# Patient Record
Sex: Male | Born: 1970 | Race: White | Hispanic: No | Marital: Married | Smoking: Never smoker
Health system: Southern US, Community
[De-identification: ages and names within clinical notes are randomized; demographics above are authoritative.]

## PROBLEM LIST (undated history)

## (undated) DIAGNOSIS — M25562 Pain in left knee: Secondary | ICD-10-CM

## (undated) DIAGNOSIS — K219 Gastro-esophageal reflux disease without esophagitis: Secondary | ICD-10-CM

## (undated) DIAGNOSIS — Z531 Procedure and treatment not carried out because of patient's decision for reasons of belief and group pressure: Secondary | ICD-10-CM

## (undated) DIAGNOSIS — F419 Anxiety disorder, unspecified: Secondary | ICD-10-CM

## (undated) DIAGNOSIS — K420 Umbilical hernia with obstruction, without gangrene: Secondary | ICD-10-CM

## (undated) DIAGNOSIS — IMO0001 Reserved for inherently not codable concepts without codable children: Secondary | ICD-10-CM

## (undated) HISTORY — PX: HERNIA REPAIR: SHX51

## (undated) HISTORY — DX: Anxiety disorder, unspecified: F41.9

## (undated) HISTORY — PX: VASECTOMY: SHX75

## (undated) HISTORY — PX: FRACTURE SURGERY: SHX138

## (undated) HISTORY — PX: EYE SURGERY: SHX253

## (undated) HISTORY — PX: ANTERIOR CRUCIATE LIGAMENT REPAIR: SHX115

## (undated) HISTORY — PX: NASAL SEPTUM SURGERY: SHX37

---

## 2011-02-21 ENCOUNTER — Other Ambulatory Visit: Payer: Self-pay

## 2011-02-21 ENCOUNTER — Ambulatory Visit (INDEPENDENT_AMBULATORY_CARE_PROVIDER_SITE_OTHER): Payer: BC Managed Care – PPO

## 2011-02-21 ENCOUNTER — Encounter: Payer: Self-pay | Admitting: *Deleted

## 2011-02-21 ENCOUNTER — Emergency Department (HOSPITAL_COMMUNITY): Payer: BC Managed Care – PPO

## 2011-02-21 ENCOUNTER — Observation Stay (HOSPITAL_COMMUNITY)
Admission: AD | Admit: 2011-02-21 | Discharge: 2011-02-21 | Disposition: A | Discharge status: Home / Self Care | Payer: BC Managed Care – PPO | Attending: Family Medicine | Admitting: Family Medicine

## 2011-02-21 DIAGNOSIS — Z531 Procedure and treatment not carried out because of patient's decision for reasons of belief and group pressure: Secondary | ICD-10-CM

## 2011-02-21 DIAGNOSIS — R030 Elevated blood-pressure reading, without diagnosis of hypertension: Secondary | ICD-10-CM

## 2011-02-21 DIAGNOSIS — IMO0001 Reserved for inherently not codable concepts without codable children: Secondary | ICD-10-CM | POA: Insufficient documentation

## 2011-02-21 DIAGNOSIS — R079 Chest pain, unspecified: Secondary | ICD-10-CM

## 2011-02-21 DIAGNOSIS — K219 Gastro-esophageal reflux disease without esophagitis: Principal | ICD-10-CM | POA: Insufficient documentation

## 2011-02-21 DIAGNOSIS — R9431 Abnormal electrocardiogram [ECG] [EKG]: Secondary | ICD-10-CM

## 2011-02-21 HISTORY — DX: Gastro-esophageal reflux disease without esophagitis: K21.9

## 2011-02-21 HISTORY — DX: Procedure and treatment not carried out because of patient's decision for reasons of belief and group pressure: Z53.1

## 2011-02-21 HISTORY — DX: Reserved for inherently not codable concepts without codable children: IMO0001

## 2011-02-21 LAB — DIFFERENTIAL
Basophils Absolute: 0 10*3/uL (ref 0.0–0.1)
Lymphocytes Relative: 18 % (ref 12–46)
Lymphs Abs: 1.1 10*3/uL (ref 0.7–4.0)
Neutro Abs: 4 10*3/uL (ref 1.7–7.7)
Neutrophils Relative %: 68 % (ref 43–77)

## 2011-02-21 LAB — BASIC METABOLIC PANEL
CO2: 24 mEq/L (ref 19–32)
Calcium: 9.8 mg/dL (ref 8.4–10.5)
Chloride: 104 mEq/L (ref 96–112)
Creatinine, Ser: 0.86 mg/dL (ref 0.50–1.35)
GFR calc Af Amer: >90 mL/min (ref >90)
Sodium: 135 mEq/L (ref 135–145)

## 2011-02-21 LAB — CBC
HCT: 42.4 % (ref 39.0–52.0)
MCH: 30.8 pg (ref 26.0–34.0)
MCV: 85.3 fL (ref 78.0–100.0)
Platelets: 198 10*3/uL (ref 150–400)
RBC: 4.94 MIL/uL (ref 4.22–5.81)
RDW: 12.8 % (ref 11.5–15.5)
RDW: 12.8 % (ref 11.5–15.5)
WBC: 5.8 10*3/uL (ref 4.0–10.5)
WBC: 6.9 10*3/uL (ref 4.0–10.5)

## 2011-02-21 LAB — COMPREHENSIVE METABOLIC PANEL
ALT: 43 U/L (ref 0–53)
AST: 26 U/L (ref 0–37)
Albumin: 3.9 g/dL (ref 3.5–5.2)
Alkaline Phosphatase: 58 U/L (ref 39–117)
BUN: 20 mg/dL (ref 6–23)
Chloride: 106 mEq/L (ref 96–112)
Potassium: 4.4 mEq/L (ref 3.5–5.1)
Sodium: 138 mEq/L (ref 135–145)
Total Protein: 6.9 g/dL (ref 6.0–8.3)

## 2011-02-21 LAB — TSH: TSH: 1.665 u[IU]/mL (ref 0.350–4.500)

## 2011-02-21 LAB — CARDIAC PANEL(CRET KIN+CKTOT+MB+TROPI)
CK, MB: 1.7 ng/mL (ref 0.3–4.0)
Troponin I: <0.3 ng/mL (ref <0.30)

## 2011-02-21 LAB — POCT I-STAT TROPONIN I: Troponin i, poc: 0 ng/mL (ref 0.00–0.08)

## 2011-02-21 MED ORDER — SODIUM CHLORIDE 0.9 % IJ SOLN: 3.0000 mL | Freq: Two times a day (BID) | INTRAMUSCULAR | Status: DC

## 2011-02-21 MED ORDER — SODIUM CHLORIDE 0.9 % IV SOLN
INTRAVENOUS | Status: DC
  Administered 2011-02-21: 100 mL via INTRAVENOUS

## 2011-02-21 MED ORDER — SODIUM CHLORIDE 0.9 % IV SOLN: 250.0000 mL | INTRAVENOUS | Status: DC | PRN

## 2011-02-21 MED ORDER — NITROGLYCERIN 0.4 MG/SPRAY TL SOLN: 1.0000 | Status: DC | PRN

## 2011-02-21 MED ORDER — PANTOPRAZOLE SODIUM 40 MG PO TBEC
40.0000 mg | DELAYED_RELEASE_TABLET | Freq: Every day | ORAL | Status: DC
  Administered 2011-02-21: 40 mg via ORAL
  Filled 2011-02-21: qty 1

## 2011-02-21 MED ORDER — ASPIRIN 81 MG PO CHEW
324.0000 mg | CHEWABLE_TABLET | Freq: Once | ORAL | Status: AC
  Administered 2011-02-21: 324 mg via ORAL
  Filled 2011-02-21: qty 4

## 2011-02-21 MED ORDER — SODIUM CHLORIDE 0.9 % IJ SOLN: 3.0000 mL | INTRAMUSCULAR | Status: DC | PRN

## 2011-02-21 MED ORDER — HEPARIN SODIUM (PORCINE) 5000 UNIT/ML IJ SOLN
5000.0000 [IU] | Freq: Three times a day (TID) | INTRAMUSCULAR | Status: DC
  Administered 2011-02-21: 5000 [IU] via SUBCUTANEOUS
  Filled 2011-02-21 (×3): qty 1

## 2011-02-21 MED ORDER — NITROGLYCERIN 0.4 MG SL SUBL
0.4000 mg | SUBLINGUAL_TABLET | SUBLINGUAL | Status: DC | PRN
  Administered 2011-02-21: 0.4 mg via SUBLINGUAL
  Filled 2011-02-21: qty 25

## 2011-02-21 NOTE — ED Provider Notes (Signed)
History     CSN: [id number]  Arrival date & time 02/21/11  0848   Chief Complaint  Patient presents with  . Chest Pain    HPI Pt was seen at 0850.  Per pt, c/o gradual onset and persistence of constant left sided chest "pain" that began this morning after he woke up.  Describes the pain as "pressure" and "dull."  Denies radiation of pain, SOB, nausea or diaphoresis.  States he had similar episode approx 1 week ago that lasted 1 day before resolving.  Pt has not taken any OTC's for pain.  Pt went to [facility], then was sent to the ED for further eval.  Denies palpitations, no cough, no N/V/D, no abd pain, no back pain, no fevers, no injury.     History reviewed. No pertinent past medical history.  Past Surgical History  Procedure Date  . Fracture surgery   . Anterior cruciate ligament repair     Family History  Problem Relation Age of Onset  . Hypertension Father     History  Substance Use Topics  . Smoking status: Never Smoker   . Smokeless tobacco: Not on file  . Alcohol Use: 2.4 oz/week    4 Cans of beer per week    Review of Systems ROS: Statement: All systems negative except as marked or noted in the HPI; Constitutional: Negative for fever and chills. ; ; Eyes: Negative for eye pain, redness and discharge. ; ; ENMT: Negative for ear pain, hoarseness, nasal congestion, sinus pressure and sore throat. ; ; Cardiovascular: +CP.  Negative for palpitations, diaphoresis, dyspnea and peripheral edema. ; ; Respiratory: Negative for cough, wheezing and stridor. ; ; Gastrointestinal: Negative for nausea, vomiting, diarrhea, abdominal pain, blood in stool, hematemesis, jaundice and rectal bleeding. . ; ; Genitourinary: Negative for dysuria, flank pain and hematuria. ; ; Musculoskeletal: Negative for back pain and neck pain. Negative for swelling and trauma.; ; Skin: Negative for pruritus, rash, abrasions, blisters, bruising and skin lesion.; ; Neuro: Negative for headache,  lightheadedness and neck stiffness. Negative for weakness, altered level of consciousness , altered mental status, extremity weakness, paresthesias, involuntary movement, seizure and syncope.     Allergies  Review of patient's allergies indicates no known allergies.  Home Medications   Current Outpatient Rx  Name Route Sig Dispense Refill  . IBUPROFEN 200 MG PO TABS Oral Take 800 mg by mouth 3 (three) times daily as needed. As needed for headache    . OVER THE COUNTER MEDICATION Oral Take 2 tablets by mouth once. Over the counter sinus product       BP 158/96  Temp(Src) 98.3 F (36.8 C) (Oral)  Resp 16  Ht 5\' 7"  (1.702 m)  Wt 213 lb (96.616 kg)  BMI 33.36 kg/m2  SpO2 97%  Physical Exam 0855: Physical examination:  Nursing notes reviewed; Vital signs and O2 SAT reviewed;  Constitutional: Well developed, Well nourished, Well hydrated, In no acute distress; Head:  Normocephalic, atraumatic; Eyes: EOMI, PERRL, No scleral icterus; ENMT: Mouth and pharynx normal, Mucous membranes moist; Neck: Supple, Full range of motion, No lymphadenopathy; Cardiovascular: Regular rate and rhythm, No murmur, rub, or gallop; Respiratory: Breath sounds clear & equal bilaterally, No rales, rhonchi, wheezes, or rub, Normal respiratory effort/excursion; Chest: Nontender, Movement normal; Abdomen: Soft, Nontender, Nondistended, Normal bowel sounds; Extremities: Pulses normal, No tenderness, No edema, No calf edema or asymmetry.; Neuro: AA&Ox3, Major CN grossly intact.  No gross focal motor or sensory deficits in extremities.;  Skin: Color normal, Warm, Dry, no rash.    ED Course  Procedures   MDM  MDM Reviewed: nursing note and vitals Interpretation: labs, ECG and x-ray    Date: 02/21/2011  Rate: 93  Rhythm: normal sinus rhythm  QRS Axis: normal  Intervals: normal  ST/T Wave abnormalities: nonspecific T wave changes, flipped T-waves ant leads  Conduction Disutrbances:none  Narrative Interpretation:    Old EKG Reviewed: none available.  Results for orders placed during the hospital encounter of 02/21/11  BASIC METABOLIC PANEL      Component Value Range   Sodium 135  135 - 145 (mEq/L)   Potassium 4.1  3.5 - 5.1 (mEq/L)   Chloride 104  96 - 112 (mEq/L)   CO2 24  19 - 32 (mEq/L)   Glucose, Bld 114 (*) 70 - 99 (mg/dL)   BUN 20  6 - 23 (mg/dL)   Creatinine, Ser 0.98  0.50 - 1.35 (mg/dL)   Calcium 9.8  8.4 - 11.9 (mg/dL)   GFR calc non Af Amer >90  >90 (mL/min)   GFR calc Af Amer >90  >90 (mL/min)  CBC      Component Value Range   WBC 5.8  4.0 - 10.5 (K/uL)   RBC 4.94  4.22 - 5.81 (MIL/uL)   Hemoglobin 15.2  13.0 - 17.0 (g/dL)   HCT 14.7  82.9 - 56.2 (%)   MCV 84.8  78.0 - 100.0 (fL)   MCH 30.8  26.0 - 34.0 (pg)   MCHC 36.3 (*) 30.0 - 36.0 (g/dL)   RDW 13.0  86.5 - 78.4 (%)   Platelets 198  150 - 400 (K/uL)  DIFFERENTIAL      Component Value Range   Neutrophils Relative 68  43 - 77 (%)   Neutro Abs 4.0  1.7 - 7.7 (K/uL)   Lymphocytes Relative 18  12 - 46 (%)   Lymphs Abs 1.1  0.7 - 4.0 (K/uL)   Monocytes Relative 11  3 - 12 (%)   Monocytes Absolute 0.7  0.1 - 1.0 (K/uL)   Eosinophils Relative 2  0 - 5 (%)   Eosinophils Absolute 0.1  0.0 - 0.7 (K/uL)   Basophils Relative 0  0 - 1 (%)   Basophils Absolute 0.0  0.0 - 0.1 (K/uL)  POCT I-STAT TROPONIN I      Component Value Range   Troponin i, poc 0.00  0.00 - 0.08 (ng/mL)   Comment 3            Dg Chest Port 1 View  02/21/2011  *RADIOLOGY REPORT*  Clinical Data: Left-sided chest pain and pressure  PORTABLE CHEST - 1 VIEW  Comparison: None.  Findings: The lungs are clear. The heart appears borderline enlarged.  No bony abnormality is seen.  IMPRESSION: No active lung disease.  Borderline cardiomegaly.  Original Report Authenticated By: Juline Patch, M.D.      10:15 AM:  Symptoms relieved after ntg and ASA.  EKG with TWI, no old to compare.  Dx testing d/w pt and family.  Questions answered.  Verb understanding, agreeable to  admit.  1050:  T/C to [facility] resident, case discussed, including:  HPI, pertinent PM/SHx, VS/PE, dx testing, ED course and treatment.  Agreeable to admit, will come to ED for eval.       [facility]     Laray Anger, DO 02/24/11 [phone]

## 2011-02-21 NOTE — H&P (Signed)
Family Medicine Teaching [facility] Admission History and Physical  Patient name: Curtis Arellano Medical record number: [id number] Date of birth: 1970-04-18 Age: 40 y.o. Gender: male  Primary Care Provider: No primary provider on file.  Chief Complaint: Chest Pain  History of Present Illness: Curtis Arellano is a 40 y.o. year old male who presents today with CP x 1 day. Pt noticed intermittent CP this am. Pt describes CP as central in nature, without radiation. This lasted seconds. No associated nausea, diaphoresis. Pt states that episodes of CP have been associated with belching and passing gas. Pt states that he was concerned about CP because he had a friend at work who died of MI 2 months ago.  Pt went to [facility] for this this am and was noted to have inverted t waves in consecutive lateral leads. Pt does not have a previous EKG to compare this. Pt was sent to ED via EMS. Pt received NTG x 1 and this helped relieve CP. Pt was brought in to ED. Pt was noted to have persistence of t wave inversions in lateral leads (V4 and V5). Pt states that he has had 3-4 episodes of CP this am. Pt is a non smoker, no family of early MI. No hx/o CAD.  There is no problem list on file for this patient.  Past Medical History: History reviewed. No pertinent past medical history.  Past Surgical History: Past Surgical History  Procedure Date  . Fracture surgery   . Anterior cruciate ligament repair     Social History: History   Social History  . Marital Status: Single    Spouse Name: N/A    Number of Children: N/A  . Years of Education: N/A   Social History Main Topics  . Smoking status: Never Smoker   . Smokeless tobacco: None  . Alcohol Use: 2.4 oz/week    4 Cans of beer per week  . Drug Use: No  . Sexually Active:    Other Topics Concern  . None   Social History Narrative  . None    Family History: Family History  Problem Relation Age of Onset  . Hypertension Father      Allergies: No Known Allergies  Current Facility-Administered Medications  Medication Dose Route Frequency Provider Last Rate Last Dose  . 0.9 %  sodium chloride infusion   Intravenous Continuous Laray Anger, DO 100 mL/hr at 02/21/11 0923 100 mL at 02/21/11 0923  . 0.9 %  sodium chloride infusion  250 mL Intravenous PRN Doree Albee      . aspirin chewable tablet 324 mg  324 mg Oral Once Laray Anger, DO   324 mg at 02/21/11 0916  . heparin injection 5,000 Units  5,000 Units Subcutaneous Q8H Doree Albee      . nitroGLYCERIN (NITROLINGUAL) 0.4 MG/SPRAY spray 1 spray  1 spray Sublingual Q5 Min x 3 PRN Doree Albee      . nitroGLYCERIN (NITROSTAT) SL tablet 0.4 mg  0.4 mg Sublingual Q5 min PRN Laray Anger, DO   0.4 mg at 02/21/11 4696  . pantoprazole (PROTONIX) EC tablet 40 mg  40 mg Oral Q1200 Doree Albee      . sodium chloride 0.9 % injection 3 mL  3 mL Intravenous Q12H Doree Albee      . sodium chloride 0.9 % injection 3 mL  3 mL Intravenous PRN Doree Albee       Current Outpatient Prescriptions  Medication Sig Dispense Refill  .  ibuprofen (ADVIL,MOTRIN) 200 MG tablet Take 800 mg by mouth 3 (three) times daily as needed. As needed for headache      . OVER THE COUNTER MEDICATION Take 2 tablets by mouth once. Over the counter sinus product        Review Of Systems: Per HPI  otherwise 12 point review of systems was performed and was unremarkable.  Physical Exam: Filed Vitals:   02/21/11 1131  BP: 127/80  Pulse: 78  Temp:   Resp:     General: alert and cooperative HEENT: PERRLA, extra ocular movement intact and sclera clear, anicteric Heart: S1, S2 normal, no murmur, rub or gallop, regular rate and rhythm Lungs: clear to auscultation, no wheezes or rales and unlabored breathing Abdomen: abdomen is soft without significant tenderness, masses, organomegaly or guarding Extremities: extremities normal, atraumatic, no cyanosis or edema Skin:no  rashes Neurology: normal without focal findings, mental status, speech normal, alert and oriented x3, PERLA and reflexes normal and symmetric  Labs and Imaging: Lab Results  Component Value Date/Time   NA 135 02/21/2011  9:15 AM   K 4.1 02/21/2011  9:15 AM   CL 104 02/21/2011  9:15 AM   CO2 24 02/21/2011  9:15 AM   BUN 20 02/21/2011  9:15 AM   CREATININE 0.86 02/21/2011  9:15 AM   GLUCOSE 114* 02/21/2011  9:15 AM   Lab Results  Component Value Date   WBC 5.8 02/21/2011   HGB 15.2 02/21/2011   HCT 41.9 02/21/2011   MCV 84.8 02/21/2011   PLT 198 02/21/2011   CXR: No active lung disease. Borderline Cardiomegaly.    Assessment and Plan: Curtis Arellano is a 40 y.o. year old male presenting with Chest Pain Chest Pain: TIMI score 1. Chest pain seems GI related. However, given consistently inverted T waves with no previous EKGs to compare this to. Will admit pt for CP rule out. Risk stratification labs. AM EKG and CXR.     FEN/GI: heart healthy diet   Prophylaxis: subq hep, ppi   Disposition: pending cardiac rule out.

## 2011-02-21 NOTE — ED Notes (Signed)
Report given to [facility], ED Yellow. Pt to be transported to ED RM 18.

## 2011-02-21 NOTE — ED Notes (Signed)
Pt woke up with pain to left side of chest. Describes pain as muscular. Pain does not radiate. Not associated with other symptoms (nausea, SHOB, diaphoresis). States had similar episode 3 weeks ago.

## 2011-02-21 NOTE — Progress Notes (Signed)
02/21/11 Nursing 1910 DC IV, DC Tele, DC Home. Discharge instructions and home medications discussed with patient and patient's family. Patient denies any questions or concerns at this time. Patient leaving unit ambulatory and appears in no acute distress at this time. Ernesta Amble, RN

## 2011-02-21 NOTE — Discharge Summary (Signed)
Physician Discharge Summary  Patient ID: Curtis Arellano MRN: [id number] DOB/AGE: 11-11-1970 40 y.o.  Admit date: 02/21/2011 Discharge date: 02/21/2011  Admission Diagnoses: Chest pain Discharge Diagnoses:  GERD  Discharged Condition: good  Hospital Course: Curtis Arellano is a 40 year old male with no significant past medical history who presented with a few episodes of chest pain that had lasted only a few seconds each.  He did not have any pain at the time of presentation nor while he was in the hospital.  He had been drinking beer and eating piazza the night before this started.  His EKG had some inverted T-waves in leads V4, V5, V6.  There were no prior EKG's on file.  The patient was admitted and ruled out with negative cardiac enzymes.  He had a repeat EKG prior to discharge that was unchanged.  He was advised to start omeprazole, to avoid NSAIDS, and to follow up at [facility] to establish a primary care provider.   Consults: none  Significant Diagnostic Studies: EKG: NSR, inverted T-waves in leads V4, V5, V6.   Pending labs at discharge: Hg A1C, LDL.   Discharge Exam: Blood pressure 137/86, pulse 86, temperature 97.9 F (36.6 C), temperature source Oral, resp. rate 20, height 5\' 7"  (1.702 m), weight 213 lb 10 oz (96.9 kg), SpO2 99.00%. General appearance: alert, cooperative and no distress Head: Normocephalic, without obvious abnormality Eyes: PERRL, EOMIT Resp: clear to auscultation bilaterally Chest wall: no tenderness Cardio: regular rate and rhythm, S1, S2 normal, no murmur, click, rub or gallop GI: soft, non-tender; bowel sounds normal; no masses,  no organomegaly Extremities: extremities normal, atraumatic, no cyanosis or edema Pulses: 2+ and symmetric Neurologic: Grossly normal  Disposition: Final discharge disposition not confirmed  Discharge Orders    Future Orders Please Complete By Expires   Diet - low sodium heart healthy      Increase activity  slowly      Discharge instructions      Comments:   Your pain was most likely caused by gastroesophageal reflux disease, or esophogeal spasm.  I recommend you start taking Prilosec OTC 20 mg by mouth daily (Generic name is omeprazole).  This should help prevent heartburn pain.  Remember that ibuprofen, aleve, or other NSAID medications can irritate your stomach and esophagus and make these sypmtoms worse.      Call MD for:  severe uncontrolled pain      Call MD for:  difficulty breathing, headache or visual disturbances      Call MD for:  persistant dizziness or light-headedness        Medication List  As of 02/21/2011  6:14 PM   CONTINUE taking these medications         OVER THE COUNTER MEDICATION         STOP taking these medications         ibuprofen 200 MG tablet           Follow-up Information    Follow up with [facility]. Call in 2 weeks. [phone])    Contact information:   [address] [place] [address]          Signed: Ardyth Gal 02/21/2011, 6:14 PM

## 2011-02-21 NOTE — H&P (Signed)
[facility]MT[facility] Attending Admission Note  Patient seen and examined by me, discussed with resident team.  Mr. Curtis Arellano is a 40 yr old M with no family/personal hx of heart disease or DM, never-smoker, who presents today to ED with c/o intermittent sudden onset L sided chest discomfort which he associates with belching.  Reports eating pizza and drinking beer late last night, denies diaphoresis/dyspnea/nausea associated with chest complaint.  Lasts intermittently for 'seconds", then resolves.  None at present. Has had some reflux recently which he associates with poor dietary habits.  Previously he had lost 30 lbs with running (was up to 6-8 miles three times weekly), then lapsed on exercise and fell into poor eating habits.  Well appearing, no apparent distress.  HEENT Neck supple.  COR S1S2, no extra sounds PULM Clear bilaterally ABD Soft, nontender, nondistended. No masses or megaly.  Assess/Plan: 40yoM with intermittent chest discomfort that is not consistent with cardiac chest pain.  While I have not calculated his BMI, I would suspect he falls in the "overweight" or "obese" range at present.  Will follow up on second set of CE's and follow-up ECG; if negative and without changes in ECG, would discharge on PPI for outpatient follow-up.  For lipid panel as part of risk assessment. Paula Compton, M.D.

## 2011-02-22 LAB — HEMOGLOBIN A1C: Mean Plasma Glucose: 114 mg/dL (ref <117)

## 2011-03-04 DIAGNOSIS — IMO0001 Reserved for inherently not codable concepts without codable children: Secondary | ICD-10-CM

## 2011-03-04 HISTORY — PX: UPPER GASTROINTESTINAL ENDOSCOPY: SHX188

## 2011-03-04 HISTORY — DX: Reserved for inherently not codable concepts without codable children: IMO0001

## 2011-12-10 ENCOUNTER — Ambulatory Visit (INDEPENDENT_AMBULATORY_CARE_PROVIDER_SITE_OTHER): Payer: BC Managed Care – PPO | Admitting: Family Medicine

## 2011-12-10 VITALS — BP 130/86 | HR 60 | Temp 98.0°F | Resp 16 | Ht 67.0 in | Wt 199.0 lb

## 2011-12-10 DIAGNOSIS — K429 Umbilical hernia without obstruction or gangrene: Secondary | ICD-10-CM

## 2011-12-10 DIAGNOSIS — K219 Gastro-esophageal reflux disease without esophagitis: Secondary | ICD-10-CM

## 2011-12-10 DIAGNOSIS — R079 Chest pain, unspecified: Secondary | ICD-10-CM

## 2011-12-10 MED ORDER — GI COCKTAIL ~~LOC~~
30.0000 mL | Freq: Once | ORAL | Status: AC
  Administered 2011-12-10: 30 mL via ORAL

## 2011-12-10 NOTE — Patient Instructions (Addendum)
Start taking a baby aspirin (81 mg) daily- use the coated kind.  If your chest pain comes back seek care.

## 2011-12-10 NOTE — Progress Notes (Signed)
[facility] [address], [place] [address] [phone]  Date:  12/10/2011   Name:  Curtis Arellano   DOB:  1970-04-07   MRN:  [id number]  PCP:  No primary provider on file.    Chief Complaint: Heartburn, Nasal Congestion and Chills   History of Present Illness:  Curtis Arellano is a 41 y.o. very pleasant male patient who presents with the following:  Here today to discuss illness.  About one week ago he noted severe heart burn that lasted about 2 hours- it occurred after he ate a hot and spicy meal.  It felt like a chest pressure, "maybe some burning."  He felt the pain all the way across his chest.  It did not vary with changing position.  Not worse with walking or lying down. This has not come back as severely, but he has continued to have some minor pains that can move around his chest since last Thursday.  These pains are more sharp and only last a few seconds.   He also has noted sinus congestion for the last several days.   He also noted occasional pains in different areas of his body when he was having the heartburn.    He used some mucinex cold medication over the last couple of days.  He also uses prilosec for GERD- he started this a week ago. This morning while at work he noted some chills, and he noted that his left hand appeared to be lighter in color than the right.  He now feels fine.    About one year ago he had a similar incident- he had an EKG and was sent to the hospital- he was released to home after negative cardiac enzymes. He never did see cards or have a stress test at that time.  He did not follow- up to see [place] for further care.  He is not sure if anxiety could play a part in his symptoms.    No cough, no fever.  No sweats.  No ST, no earache.  No SOB There is no family history of CAD.  He has never been a smoker.   He has played golf and run without any CP recently.  He is an avid Teacher, English as a foreign language.    There is no problem list on file for  this patient.   Past Medical History  Diagnosis Date  . Refusal of blood transfusions as patient is Jehovah's Witness 02/21/11  . GERD (gastroesophageal reflux disease)     Past Surgical History  Procedure Date  . Anterior cruciate ligament repair ~ 1998    left knee  . Fracture surgery ~ 1992    broken nose  . Nasal septum surgery ~ 1990  . Eye surgery ~ 2004    "growth on right eye cut off"    History  Substance Use Topics  . Smoking status: Never Smoker   . Smokeless tobacco: Never Used  . Alcohol Use: 2.4 oz/week    4 Cans of beer per week    Family History  Problem Relation Age of Onset  . Hypertension Father     No Known Allergies  Medication list has been reviewed and updated.  Current Outpatient Prescriptions on File Prior to Visit  Medication Sig Dispense Refill  . omeprazole (PRILOSEC) 10 MG capsule Take 10 mg by mouth daily.      Marland Kitchen OVER THE COUNTER MEDICATION Take 2 tablets by mouth once. Over the counter sinus  product         Review of Systems:  As per HPI- otherwise negative.   Physical Examination: Filed Vitals:   12/10/11 1113  BP: 130/86  Pulse: 60  Temp: 98 F (36.7 C)  Resp: 16   Filed Vitals:   12/10/11 1113  Height: 5\' 7"  (1.702 m)  Weight: 199 lb (90.266 kg)   Body mass index is 31.17 kg/(m^2). Ideal Body Weight: Weight in (lb) to have BMI = 25: 159.3   GEN: WDWN, NAD, Non-toxic, A & O x 3, overweight HEENT: Atraumatic, Normocephalic. Neck supple. No masses, No LAD.  Bilateral TM wnl, oropharynx normal.  PEERL,EOMI.   Ears and Nose: No external deformity. CV: RRR, No M/G/R. No JVD. No thrill. No extra heart sounds.  I cannot reproduce CP by pressing on chest.  PULM: CTA B, no wheezes, crackles, rhonchi. No retractions. No resp. distress. No accessory muscle use. ABD: S, NT, ND, +BS. No rebound. No HSM.  Small umbilical hernia which is not tender EXTR: No c/c/e NEURO Normal gait.  PSYCH: Normally interactive. Conversant. Not  depressed or anxious appearing.  Calm demeanor.   GI cocktail: did not make a difference but he stated his CP was gone prior to taking the cocktail EKG: NSR, no concerning changes. Flipped T waves seen on his EKG last December are resolved.    Assessment and Plan: 1. Chest pain  EKG 12-Lead  2. GERD (gastroesophageal reflux disease)  gi cocktail (Maalox,Lidocaine,Donnatal)  3. Umbilical hernia  Ambulatory referral to General Surgery   Atypical CP that seems to be due to GERD.  However, cautioned him that if this continues he will need to have further evaluation- referral to cards for probable ETT.  He will let us know if he has any more problems.  Referral to surgery to look at his hernia per his request.   Abbe Amsterdam, MD

## 2011-12-11 ENCOUNTER — Telehealth: Payer: Self-pay

## 2011-12-11 DIAGNOSIS — R079 Chest pain, unspecified: Secondary | ICD-10-CM

## 2011-12-11 NOTE — Telephone Encounter (Signed)
Thanks, patient advised Cardiology referral pending he is going to keep taking the Prilosec until then.

## 2011-12-11 NOTE — Telephone Encounter (Signed)
Called patient to see what I can help him with. He wants to know if he can have a stress test done. He states the Prilosec not helping wants to know if something else may help him more, he has taken it for 7 days is still having symptoms. Please advise and I will call patient back.

## 2011-12-11 NOTE — Telephone Encounter (Signed)
PT STATES WAS SEEN BY DR [facility] EVEN THOUGH THE SYSTEM SAYS DR COPLAND, HE HAVE A COUPLE OF QUESTIONS PLEASE CALL [phone]

## 2011-12-11 NOTE — Telephone Encounter (Signed)
It looks like Dr Patsy Lager indicated in her note that the next step would be a cardiology referral.  Has he seen a cardiologist in the past?  Is there someone he would prefer to see?  I am happy to put the referral in

## 2011-12-26 ENCOUNTER — Ambulatory Visit (INDEPENDENT_AMBULATORY_CARE_PROVIDER_SITE_OTHER): Payer: BC Managed Care – PPO | Admitting: General Surgery

## 2011-12-31 ENCOUNTER — Ambulatory Visit: Payer: BC Managed Care – PPO | Admitting: Family Medicine

## 2011-12-31 VITALS — BP 116/90 | HR 81 | Temp 97.2°F | Resp 18 | Wt 190.0 lb

## 2011-12-31 DIAGNOSIS — F411 Generalized anxiety disorder: Secondary | ICD-10-CM

## 2011-12-31 DIAGNOSIS — K219 Gastro-esophageal reflux disease without esophagitis: Secondary | ICD-10-CM

## 2011-12-31 DIAGNOSIS — F419 Anxiety disorder, unspecified: Secondary | ICD-10-CM

## 2011-12-31 DIAGNOSIS — R079 Chest pain, unspecified: Secondary | ICD-10-CM

## 2011-12-31 MED ORDER — ALPRAZOLAM 0.5 MG PO TABS: ORAL_TABLET | ORAL | Status: DC

## 2011-12-31 NOTE — Progress Notes (Signed)
[address]   [place], [place]  [address]   [phone]  Subjective:    Patient ID: Curtis Arellano, male    DOB: March 21, 1970, 41 y.o.   MRN: [id number]  HPIThis 41 y.o. male presents for three week follow-up for chest pain.  Last evaluated by Dr. Patsy Lager 12/10/11; s/p EKG and GI cocktail.  Diagnosed with atypical chest pain, GERD.  Advised to continue Prilosec and dietary modification.   Pt called back on 12/11/11 requesting referral to cardiology for stress testing; never contacted regarding referral to cardiology.    Suffered with a lot of burning across B chest last night while sleeping.  This morning, no burning but feeling some pains lateral B breasts.  Last 02/2011, underwent evaluation in ED.  Worried about anxiety or panic attack.  Last night, walked up stairs and noticed rapid heart rate.  No SOB, no chest pain.  Earlier, small amount of substernal pressure.  Has also had a cold for past five days.  Taking Nyquil some this week.  Last night burning sensation lasted 1 hour; spontaneously resolved.  Awoke this morning without burning.  Did eat peanut butter crackers last night with chocolate.  No associated diaphoresis, SOB, nausea.  Intermittent calf pain.  Will have sporadic upper arm pains also.  Breast lateral pain will last a few seconds.  No exercise lately; played golf today.  Worrying about chest pain.   Took Zantac this morning at 9:30.  No longer taking Prilosec in past week.  Really thinks anxiety; will get flushed feeling.  No family history of heart disease; father is taking Nexium.  Took some of dad's Nexium.  Brother takes probiotic.  No tobacco; alcohol on weekends.  No drugs.  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  No leg swelling or redness; no prolonged immobilization.   Scheduled for CPE next week.  Has lost week in past three weeks; afraid to eat due to fear of recurrent chest pain.  Since 02/2011 has been trying to eat healthier.  Severity of pain last night 5/10.  No  abdominal pain, nausea, vomiting, diarrhea; no change in bowel habits.  Caffeine intake now six cups of coffee.  Previously ran 6 miles three times per week.  Has appointment with Dr. Eden Emms on 01/08/12 per scheduling/referral coordinator.  Having anxiety once or twice weekly on average.  No daily anxiety.  No worsening pain with position changes.  No chronic cough or SOB.    Review of Systems  Constitutional: Negative for fever, chills, diaphoresis and fatigue.  HENT: Positive for congestion, sore throat, rhinorrhea and voice change. Negative for trouble swallowing.   Respiratory: Negative for cough, chest tightness, shortness of breath, wheezing and stridor.   Cardiovascular: Positive for chest pain. Negative for palpitations and leg swelling.  Gastrointestinal: Negative for vomiting, abdominal pain, diarrhea and constipation.  Musculoskeletal: Positive for myalgias. Negative for back pain.  Skin: Negative for color change, pallor and rash.  Psychiatric/Behavioral: Negative for suicidal ideas, disturbed wake/sleep cycle, self-injury and dysphoric mood. The patient is nervous/anxious.         Past Medical History  Diagnosis Date  . Refusal of blood transfusions as patient is Jehovah's Witness 02/21/11  . GERD (gastroesophageal reflux disease)     Past Surgical History  Procedure Date  . Anterior cruciate ligament repair ~ 1998    left knee  . Fracture surgery ~ 1992    broken nose  . Nasal septum surgery ~ 1990  . Eye  surgery ~ 2004    "growth on right eye cut off"    Prior to Admission medications   Medication Sig Start Date End Date Taking? Authorizing Provider  OVER THE COUNTER MEDICATION Take 2 tablets by mouth once. Over the counter sinus product     Historical Provider, MD    No Known Allergies  History   Social History  . Marital Status: Single    Spouse Name: N/A    Number of Children: N/A  . Years of Education: N/A   Occupational History  . Not on file.    Social History Main Topics  . Smoking status: Never Smoker   . Smokeless tobacco: Never Used  . Alcohol Use: 2.4 oz/week    4 Cans of beer per week  . Drug Use: No  . Sexually Active: Yes   Other Topics Concern  . Not on file   Social History Narrative  . No narrative on file    Family History  Problem Relation Age of Onset  . Hypertension Father     Objective:   Physical Exam  Nursing note and vitals reviewed. Constitutional: He is oriented to person, place, and time. He appears well-developed and well-nourished. No distress.  HENT:  Head: Normocephalic and atraumatic.  Right Ear: External ear normal.  Left Ear: External ear normal.  Nose: Nose normal.  Mouth/Throat: Oropharynx is clear and moist.  Eyes: Conjunctivae normal and EOM are normal. Pupils are equal, round, and reactive to light.  Neck: Normal range of motion. Neck supple. No JVD present. No thyromegaly present.  Cardiovascular: Normal rate, regular rhythm, normal heart sounds and intact distal pulses.  Exam reveals no gallop and no friction rub.   No murmur heard. Pulmonary/Chest: Effort normal and breath sounds normal. No respiratory distress. He has no wheezes. He has no rales. He exhibits no tenderness.  Abdominal: Soft. Bowel sounds are normal. He exhibits no distension and no mass. There is no tenderness. There is no rebound and no guarding.  Lymphadenopathy:    He has no cervical adenopathy.  Neurological: He is alert and oriented to person, place, and time. No cranial nerve deficit. He exhibits normal muscle tone.  Skin: Skin is warm and dry. He is not diaphoretic.  Psychiatric: He has a normal mood and affect. His behavior is normal. Judgment and thought content normal.     EKG:  NSR; NO ACUTE CHANGES; UNCHANGED FROM EKG THREE WEEKS AGO.     Assessment & Plan:   1. Chest pain  EKG 12-Lead  2. Anxiety  ALPRAZolam (XANAX) 0.5 MG tablet     1.  Chest Pain:  New.  Atypical. Consistent with  GERD versus anxiety.  Appointment on January 08, 2012 with cardiology for stress testing.  Recommend starting Zantac 150mg  bid for next 1-2 weeks.  Rx for Xanax for intermittent anxiety. 2.  GERD: Persistent.  Recommend starting Zantac 150mg  bid scheduled for next two weeks.  3. Anxiety: Chronic with recent worsening.  Agreeable to PRN Xanax. If needing Xanax daily, will need RTC to initiate SSRI.  Pt expressed understanding.  Meds ordered this encounter  Medications  . ALPRAZolam (XANAX) 0.5 MG tablet    Sig: 1/2 to 1 tablet daily PRN anxiety    Dispense:  30 tablet    Refill:  0

## 2011-12-31 NOTE — Patient Instructions (Signed)
1. Chest pain  EKG 12-Lead  2. Anxiety  ALPRAZolam (XANAX) 0.5 MG tablet     1.  LIMIT CAFFEINE TO 2 SERVINGS DAILY. 2. AFTER CARDIOLOGY APPOINTMENT, RECOMMEND  REGULAR EXERCISE FOR STRESS/ANXIETY MANAGEMENT.   3.  IF NEEDING TO TAKE XANAX DAILY, NEED TO RETURN TO OFFICE FOR FURTHER MANAGEMENT OF ANXIETY.

## 2012-01-03 ENCOUNTER — Ambulatory Visit: Payer: BC Managed Care – PPO

## 2012-01-03 ENCOUNTER — Ambulatory Visit: Payer: BC Managed Care – PPO | Admitting: Family Medicine

## 2012-01-03 VITALS — BP 128/91 | HR 65 | Temp 97.9°F | Resp 18 | Ht 67.0 in | Wt 190.0 lb

## 2012-01-03 DIAGNOSIS — R079 Chest pain, unspecified: Secondary | ICD-10-CM

## 2012-01-03 DIAGNOSIS — J0100 Acute maxillary sinusitis, unspecified: Secondary | ICD-10-CM

## 2012-01-03 DIAGNOSIS — J209 Acute bronchitis, unspecified: Secondary | ICD-10-CM

## 2012-01-03 DIAGNOSIS — J9801 Acute bronchospasm: Secondary | ICD-10-CM

## 2012-01-03 MED ORDER — AMOXICILLIN-POT CLAVULANATE 875-125 MG PO TABS: 1.0000 | ORAL_TABLET | Freq: Two times a day (BID) | ORAL | Status: DC

## 2012-01-03 MED ORDER — ALBUTEROL SULFATE (5 MG/ML) 0.5% IN NEBU: 2.5000 mg | INHALATION_SOLUTION | Freq: Four times a day (QID) | RESPIRATORY_TRACT | Status: DC

## 2012-01-03 MED ORDER — IPRATROPIUM BROMIDE 0.03 % NA SOLN: 2.0000 | Freq: Two times a day (BID) | NASAL | Status: DC

## 2012-01-03 NOTE — Patient Instructions (Addendum)
1. Bronchospasm  DG Chest 2 View, albuterol (PROVENTIL) (5 MG/ML) 0.5% nebulizer solution 2.5 mg  2. Chest pain  DG Chest 2 View, albuterol (PROVENTIL) (5 MG/ML) 0.5% nebulizer solution 2.5 mg  3. Acute bronchitis  albuterol (PROVENTIL) (5 MG/ML) 0.5% nebulizer solution 2.5 mg, amoxicillin-clavulanate (AUGMENTIN) 875-125 MG per tablet, ipratropium (ATROVENT) 0.03 % nasal spray  4. Sinusitis, acute maxillary  ipratropium (ATROVENT) 0.03 % nasal spray

## 2012-01-03 NOTE — Progress Notes (Signed)
[address]   [place], [place]  [address]   [phone]  Subjective:    Patient ID: Curtis Arellano, male    DOB: 07-Aug-1970, 41 y.o.   MRN: [id number]  HPIThis 41 y.o. male presents for evaluation of cold symptoms.  Onset of symptoms one week ago.  Awoke with chest congestion.  Took Mucinex for first time last night.  Increased coughing so would like CXR.  No fever/chills/sweats.  No headache today but has suffered with headache in past week.  Mild sinus pressure in mornings.  No sore throat or ear pain.  L ear congestion/clogged.  +nasal congestion; no rhinorrhea; clear.  +coughing; +sputum production this morning; unknown color.  No SOB.  Went to mall with children last night; felt well.  No v/d.  Did take [employer]-fed twice.  No sneezing, itchy eyes, itchy nose.  No allergic rhinitis that he is aware of.  Still feels chest congestion. Seen this week for chest pain; pt declined CXR and now desires CXR due to chest congestion and recent chest pain.  No history of asthma; has heard wheezing recently.  2.  Anxiety: took Xanax one whole tablet last night and slept well.      Review of Systems  Constitutional: Negative for chills, diaphoresis and fatigue.  HENT: Positive for congestion, postnasal drip and sinus pressure. Negative for sore throat, rhinorrhea, sneezing, trouble swallowing, neck stiffness, dental problem and ear discharge.   Respiratory: Positive for cough and wheezing. Negative for shortness of breath.   Cardiovascular: Positive for chest pain.  Gastrointestinal: Negative for vomiting and diarrhea.  Psychiatric/Behavioral: The patient is nervous/anxious.                                     Objective:   Physical Exam  Nursing note and vitals reviewed. Constitutional: He is oriented to person, place, and time. He appears well-developed and well-nourished. No distress.  HENT:  Head: Normocephalic and atraumatic.  Right Ear: External ear normal.  Left Ear: External ear  normal.  Nose: Nose normal.  Mouth/Throat: Oropharynx is clear and moist.  Eyes: Conjunctivae normal are normal. Pupils are equal, round, and reactive to light.  Neck: Normal range of motion. Neck supple. No thyromegaly present.  Cardiovascular: Normal rate, regular rhythm and normal heart sounds.  Exam reveals no gallop and no friction rub.   No murmur heard. Pulmonary/Chest: Effort normal. No respiratory distress. He has no decreased breath sounds. He has wheezes in the right upper field, the right middle field and the right lower field. He has no rhonchi. He has no rales.  Lymphadenopathy:    He has no cervical adenopathy.  Neurological: He is alert and oriented to person, place, and time.  Skin: He is not diaphoretic.  Psychiatric: He has a normal mood and affect. His behavior is normal. Judgment and thought content normal.    XOPENEX NEBULIZER 0.63 ADMINISTERED IN OFFICE.  [facility] reading (PRIMARY) by  Dr. Katrinka Blazing.  CXR:  NAD      Assessment & Plan:   1. Bronchospasm  DG Chest 2 View, albuterol (PROVENTIL) (5 MG/ML) 0.5% nebulizer solution 2.5 mg  2. Chest pain  DG Chest 2 View, albuterol (PROVENTIL) (5 MG/ML) 0.5% nebulizer solution 2.5 mg  3. Acute bronchitis  albuterol (PROVENTIL) (5 MG/ML) 0.5% nebulizer solution 2.5 mg  4. Sinusitis, acute maxillary       1.  Acute Sinusitis:  New.  Rx for Augmentin, Atrovent nasal spray.  2. Acute Bronchitis: New.  Rx for Augmentin. Recommend Mucinex DM bid.  S/p Xopenex nebulizer in office.  CXR negative.  3. Bronchospasm:  New.  S/p Xopenex nebulizer in office with improvement.  Contributing to cough.  4. Anxiety: improving with PRN Xanax.   Meds ordered this encounter  Medications  . albuterol (PROVENTIL) (5 MG/ML) 0.5% nebulizer solution 2.5 mg    Sig:   . amoxicillin-clavulanate (AUGMENTIN) 875-125 MG per tablet    Sig: Take 1 tablet by mouth 2 (two) times daily.    Dispense:  20 tablet    Refill:  0  . DISCONTD: ipratropium  (ATROVENT) 0.03 % nasal spray    Sig: Place 2 sprays into the nose 2 (two) times daily.    Dispense:  30 mL    Refill:  5

## 2012-01-06 ENCOUNTER — Other Ambulatory Visit: Payer: Self-pay | Admitting: Gastroenterology

## 2012-01-06 DIAGNOSIS — K219 Gastro-esophageal reflux disease without esophagitis: Secondary | ICD-10-CM

## 2012-01-06 DIAGNOSIS — R079 Chest pain, unspecified: Secondary | ICD-10-CM

## 2012-01-08 ENCOUNTER — Ambulatory Visit (INDEPENDENT_AMBULATORY_CARE_PROVIDER_SITE_OTHER): Payer: BC Managed Care – PPO | Admitting: Family Medicine

## 2012-01-08 ENCOUNTER — Encounter: Payer: Self-pay | Admitting: Cardiology

## 2012-01-08 ENCOUNTER — Encounter: Payer: Self-pay | Admitting: Family Medicine

## 2012-01-08 VITALS — BP 120/84 | HR 74 | Temp 98.0°F | Resp 16 | Ht 65.5 in | Wt 185.0 lb

## 2012-01-08 DIAGNOSIS — Z0000 Encounter for general adult medical examination without abnormal findings: Secondary | ICD-10-CM

## 2012-01-08 DIAGNOSIS — K219 Gastro-esophageal reflux disease without esophagitis: Secondary | ICD-10-CM | POA: Insufficient documentation

## 2012-01-08 DIAGNOSIS — F419 Anxiety disorder, unspecified: Secondary | ICD-10-CM | POA: Insufficient documentation

## 2012-01-08 DIAGNOSIS — Z23 Encounter for immunization: Secondary | ICD-10-CM

## 2012-01-08 DIAGNOSIS — E7800 Pure hypercholesterolemia, unspecified: Secondary | ICD-10-CM

## 2012-01-08 LAB — POCT URINALYSIS DIPSTICK
Blood, UA: NEGATIVE
Ketones, UA: NEGATIVE
Protein, UA: NEGATIVE
Spec Grav, UA: 1.025
Urobilinogen, UA: 0.2
pH, UA: 6

## 2012-01-08 LAB — LIPID PANEL
Cholesterol: 196 mg/dL (ref 0–200)
HDL: 34 mg/dL — ABNORMAL LOW (ref >39)
Total CHOL/HDL Ratio: 5.8 Ratio
VLDL: 21 mg/dL (ref 0–40)

## 2012-01-08 LAB — COMPREHENSIVE METABOLIC PANEL
Albumin: 4.7 g/dL (ref 3.5–5.2)
Alkaline Phosphatase: 63 U/L (ref 39–117)
BUN: 17 mg/dL (ref 6–23)
Glucose, Bld: 88 mg/dL (ref 70–99)
Total Bilirubin: 0.6 mg/dL (ref 0.3–1.2)

## 2012-01-08 LAB — CBC WITH DIFFERENTIAL/PLATELET
Eosinophils Absolute: 0.2 10*3/uL (ref 0.0–0.7)
Hemoglobin: 16.2 g/dL (ref 13.0–17.0)
Lymphocytes Relative: 27 % (ref 12–46)
Lymphs Abs: 1.3 10*3/uL (ref 0.7–4.0)
MCH: 30.1 pg (ref 26.0–34.0)
MCV: 83.3 fL (ref 78.0–100.0)
Monocytes Relative: 12 % (ref 3–12)
Neutrophils Relative %: 55 % (ref 43–77)
RBC: 5.39 MIL/uL (ref 4.22–5.81)
WBC: 4.8 10*3/uL (ref 4.0–10.5)

## 2012-01-08 NOTE — Progress Notes (Signed)
Subjective:    Patient ID: Curtis Arellano, male    DOB: 10/04/1970, 41 y.o.   MRN: [id number]  HPI   This 41 y.o. Cauc male is here for CPE;he has been seen at [facility] in the last month for  evaluation of chest pain, anxiety, bronchospasms and GERD. He has had comprehensive exams  at all of the previous visits. He has a scheduled appointment with Cardiology for further work-up of  chest pain.    Review of Systems  Constitutional: Positive for chills.  HENT: Positive for nosebleeds, congestion, neck stiffness, sinus pressure and tinnitus.   Eyes: Negative.   Respiratory: Negative.   Cardiovascular: Negative.   Gastrointestinal: Negative.   Genitourinary: Negative.   Musculoskeletal: Positive for back pain.  Skin: Negative.   Neurological: Positive for headaches.  Hematological: Negative.   Psychiatric/Behavioral: Negative.        Objective:   Physical Exam  Nursing note and vitals reviewed. Constitutional: He is oriented to person, place, and time. He appears well-developed and well-nourished. No distress.  HENT:  Head: Normocephalic and atraumatic.  Right Ear: Hearing, tympanic membrane, external ear and ear canal normal.  Left Ear: Hearing, tympanic membrane, external ear and ear canal normal.  Nose: Mucosal edema and rhinorrhea present. No sinus tenderness, nasal deformity or septal deviation. Right sinus exhibits no maxillary sinus tenderness and no frontal sinus tenderness. Left sinus exhibits no maxillary sinus tenderness and no frontal sinus tenderness.  Mouth/Throat: Uvula is midline and mucous membranes are normal. No oral lesions. Normal dentition. Posterior oropharyngeal erythema present. No posterior oropharyngeal edema.  Eyes: Conjunctivae normal, EOM and lids are normal. Pupils are equal, round, and reactive to light. No scleral icterus.  Fundoscopic exam:      The right eye shows no papilledema. The right eye shows no red reflex.      The left eye shows no  papilledema. The left eye shows no red reflex. Neck: Normal range of motion. Neck supple. No thyromegaly present.  Cardiovascular: Normal rate, regular rhythm, normal heart sounds and intact distal pulses.  Exam reveals no gallop and no friction rub.   No murmur heard. Pulmonary/Chest: Effort normal and breath sounds normal. No respiratory distress. He has no wheezes. He exhibits no tenderness.  Abdominal: Soft. Bowel sounds are normal. He exhibits no distension, no abdominal bruit, no pulsatile midline mass and no mass. There is no hepatosplenomegaly. There is no tenderness. There is no guarding and no CVA tenderness. No hernia. Hernia confirmed negative in the right inguinal area and confirmed negative in the left inguinal area.  Genitourinary: Testes normal and penis normal. Right testis shows no mass, no swelling and no tenderness. Right testis is descended. Left testis shows no mass, no swelling and no tenderness. Left testis is descended.  Musculoskeletal: Normal range of motion. He exhibits no edema and no tenderness.  Lymphadenopathy:    He has no cervical adenopathy.       Right: No inguinal adenopathy present.       Left: No inguinal adenopathy present.  Neurological: He is alert and oriented to person, place, and time. He has normal reflexes. No cranial nerve deficit. He exhibits normal muscle tone. Coordination normal.  Skin: Skin is warm and dry. No erythema. No pallor.  Psychiatric: He has a normal mood and affect. His behavior is normal. Judgment and thought content normal.       Pt appears slightly anxious.         Assessment & Plan:  1. Routine general medical examination at a health care facility  Comprehensive metabolic panel, CBC with Differential, POCT urinalysis dipstick  2. Elevated LDL cholesterol level  Lipid panel  3. Need for prophylactic vaccination with combined diphtheria-tetanus-pertussis (DTP) vaccine  Tdap vaccine greater than or equal to 7yo IM  Keep  scheduled appointment with Cardiology.

## 2012-01-08 NOTE — Patient Instructions (Signed)
Keeping you healthy  Get these tests  Blood pressure- Have your blood pressure checked once a year by your healthcare provider.  Normal blood pressure is 120/80.  Weight- Have your body mass index (BMI) calculated to screen for obesity.  BMI is a measure of body fat based on height and weight. You can also calculate your own BMI at https://www.west-esparza.com/.  Cholesterol- Have your cholesterol checked regularly starting at age 41, sooner may be necessary if you have diabetes, high blood pressure, if a family member developed heart diseases at an early age or if you smoke.   Chlamydia, HIV, and other sexual transmitted disease- Get screened each year until the age of 42 then within three months of each new sexual partner.  Diabetes- Have your blood sugar checked regularly if you have high blood pressure, high cholesterol, a family history of diabetes or if you are overweight.  Get these vaccines  Flu shot- Every fall.  Tetanus shot- Every 10 years.   You received a Tdap today. Your  Next Td (Tetanus) is due in 10 years.  Menactra- Single dose; prevents meningitis.  Take these steps  Don't smoke- If you do smoke, ask your healthcare provider about quitting. For tips on how to quit, go to www.smokefree.gov or call 1-800-QUIT-NOW.  Be physically active- Exercise 5 days a week for at least 30 minutes.  If you are not already physically active start slow and gradually work up to 30 minutes of moderate physical activity.  Examples of moderate activity include walking briskly, mowing the yard, dancing, swimming bicycling, etc.  Eat a healthy diet- Eat a variety of healthy foods such as fruits, vegetables, low fat milk, low fat cheese, yogurt, lean meats, poultry, fish, beans, tofu, etc.  For more information on healthy eating, go to www.thenutritionsource.org  Drink alcohol in moderation- Limit alcohol intake two drinks or less a day.  Never drink and drive.  Dentist- Brush and floss teeth  twice daily; visit your dentis twice a year.  Depression-Your emotional health is as important as your physical health.  If you're feeling down, losing interest in things you normally enjoy please talk with your healthcare provider.  Gun Safety- If you keep a gun in your home, keep it unloaded and with the safety lock on.  Bullets should be stored separately.  Helmet use- Always wear a helmet when riding a motorcycle, bicycle, rollerblading or skateboarding.  Safe sex- If you may be exposed to a sexually transmitted infection, use a condom  Seat belts- Seat bels can save your life; always wear one.  Smoke/Carbon Monoxide detectors- These detectors need to be installed on the appropriate level of your home.  Replace batteries at least once a year.  Skin Cancer- When out in the sun, cover up and use sunscreen SPF 15 or higher.  Violence- If anyone is threatening or hurting you, please tell your healthcare provider.    Self-Exam Of The Testicles Young men ages 28-35 are the ones who most commonly get cancer of the testicles. About 300 young men die of this disease each year. Testicular cancer does occur in middle-aged or older men but to a lesser extent. This cancer can almost always be cured if it is found before it gets bad and if it is treated. WORDS TO KNOW:  Testicles are the 2 egg-shaped glands that make hormones and sperm in men.  The scrotum is the skin around testicles.  Epididymis is the rope-like part that is behind and above each  testis. It collects sperm made by the testis. WHICH MEN ARE AT HIGH RISK FOR CANCER OF THE TESTICLES?  Men between ages 14 to 45.  Men who are Caucasian.  Men who were born with a testicle that had not moved down into the scrotum.  Men whose testicles have gotten smaller because of an infection.  Men whose fathers or brothers have had cancer of the testicles. SYMPTOMS OF TESTICULAR CANCER  A painless swelling in one of your testicles.  A  hard lump. Some lumps may be an infection.  A heavy feeling in your testicles.  An ache in your lower belly (abdomen) or groin. HOW OFTEN SHOULD I CHECK MY TESTICLES? You should check your testicles every month. HOW SHOULD I DO THIS CHECK? It is best to check your testicles right after a warm shower or bath.  Look at your testicles for any swelling. You may need to use a mirror.  Use both your hands to roll each testicle between your thumb and fingers. Feel for any lumps or changes in the size of the testicle. Press firmly. You may find that one testicle is a little bigger than the other. This is normal.  Next, check the epididymis on each testicle. This is the part where most testicular cancers happen. It is normal for the epididymis to feel soft and uneven. When you get used to how your epididymis feels, you will be able to tell if there is any change.  WHAT IF I FIND ANY SWELLINGS OR LUMPS?  Call your doctor. Some lumps may be an infection. If the lump or swelling is a cancer, it can be treated before it gets worse.  Document Released: 05/16/2008 Document Revised: 05/12/2011 Document Reviewed: 05/16/2008 [facility] Patient Information 2013 [place], [place].

## 2012-01-09 NOTE — Progress Notes (Signed)
Quick Note:  Please send message to pt and advise that the following labs are abnormal... HDL ("good") cholesterol is too low- need to get it up to 39 or higher for heart protection benefits. LDL ("bad") cholesterol is too high- it needs to be at or below 100. These values put your risk of heart disease a litle above average risk. Try to improve your nutrition but cutting out excess fats, junk foods and sweets. Eat more "whole foods"- fruits and vegetables. Try to be more active; this will help raise the HDL and lower the LDL.  All other labs are normal.  Release to E-chart. ______

## 2012-01-11 ENCOUNTER — Telehealth: Payer: Self-pay

## 2012-01-11 NOTE — Telephone Encounter (Signed)
PATIENT IS CONCERNED THAT AFTER HE HAD HIS TETANUS SHOT HE IS EXPERIENCING ONLY A RASH AND WAS WONDERING IF THAT WAS NORMAL. PATIENT NOTIFIED IF SYMPTOMS GET WORSE TO PLEASE COME IN. PATIENT "JUST WANTED TO SPEAK TO SOMEONE SO HE DOESN'T SPEND OVER $100 TODAY". THANK YOU!

## 2012-01-12 ENCOUNTER — Encounter: Payer: Self-pay | Admitting: Cardiovascular Disease

## 2012-01-12 ENCOUNTER — Telehealth: Payer: Self-pay

## 2012-01-12 ENCOUNTER — Encounter: Payer: Self-pay | Admitting: Family Medicine

## 2012-01-12 ENCOUNTER — Ambulatory Visit (INDEPENDENT_AMBULATORY_CARE_PROVIDER_SITE_OTHER): Payer: BC Managed Care – PPO | Admitting: Cardiovascular Disease

## 2012-01-12 VITALS — BP 136/89 | HR 80 | Ht 65.0 in | Wt 195.0 lb

## 2012-01-12 DIAGNOSIS — K219 Gastro-esophageal reflux disease without esophagitis: Secondary | ICD-10-CM

## 2012-01-12 DIAGNOSIS — F411 Generalized anxiety disorder: Secondary | ICD-10-CM

## 2012-01-12 DIAGNOSIS — F419 Anxiety disorder, unspecified: Secondary | ICD-10-CM

## 2012-01-12 DIAGNOSIS — R079 Chest pain, unspecified: Secondary | ICD-10-CM

## 2012-01-12 DIAGNOSIS — R21 Rash and other nonspecific skin eruption: Secondary | ICD-10-CM

## 2012-01-12 NOTE — Assessment & Plan Note (Signed)
Atypical normal ECG recurrent visits to urgent care  F/U ETT

## 2012-01-12 NOTE — Assessment & Plan Note (Signed)
Likely allergy to PCN  D/C amoxacillin .  Continue benedryl F/U urgent care

## 2012-01-12 NOTE — Patient Instructions (Signed)
Your physician recommends that you schedule a follow-up appointment in:  AS NEEDED Your physician recommends that you continue on your current medications as directed. Please refer to the Current Medication list given to you today. Your physician has requested that you have an exercise tolerance test. For further information please visit [email or web address] Please also follow instruction sheet, as given.  DX CHEAT PAIN

## 2012-01-12 NOTE — Assessment & Plan Note (Signed)
Continue PPI  Avoid carbs, chocolate and other spicy foods

## 2012-01-12 NOTE — Assessment & Plan Note (Signed)
Likely major issue regarding somatic complaints

## 2012-01-12 NOTE — Telephone Encounter (Signed)
Patient advised probably from the PCN not to take again. He states he is improving.

## 2012-01-12 NOTE — Progress Notes (Signed)
Patient ID: Curtis Arellano, male   DOB: 07-Oct-1970, 41 y.o.   MRN: [id number] 41 yo referred by urgent care.   Evaluated by Dr. Patsy Lager 12/10/11; s/p EKG and GI cocktail. Diagnosed with atypical chest pain, GERD. Advised to continue Prilosec and dietary modification. Pt called back on 12/11/11 requesting referral to cardiology for stress testing; never contacted regarding referral to cardiology. Suffered with a lot of burning across B chest last night while sleeping. This morning, no burning but feeling some pains lateral B breasts. Last 02/2011, underwent evaluation in ED. Worried about anxiety or panic attack. Last month walked up stairs and noticed rapid heart rate. No SOB, no chest pain. Earlier, small amount of substernal pressure. Has also had a cold for past five days. Taking Nyquil some this week. Burning sensation lasted 1 hour; spontaneously resolved. May have been made worse by peanut butter crackers . No associated diaphoresis, SOB, nausea. Intermittent calf pain. Will have sporadic upper arm pains also. Breast lateral pain will last a few seconds. No exercise lately; played golf today. Worrying about chest pain.Really thinks anxiety; will get flushed feeling. No family history of heart disease; father is taking Nexium. Took some of dad's Nexium. Brother takes probiotic. No tobacco; alcohol on weekends. No drugs. 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 No leg swelling or redness; no prolonged immobilization. Scheduled for CPE next week. Has lost week in past three weeks; afraid to eat due to fear of recurrent chest pain. Since 02/2011 has been trying to eat healthier. Severity of pain  5/10. No abdominal pain, nausea, vomiting, diarrhea; no change in bowel habits. Caffeine intake now six cups of coffee. Previously ran 6 miles three times per week. . Having anxiety once or twice weekly on average. No daily anxiety. No worsening pain with position changes. No chronic cough or SOB.  Has had rash since  being on amoxicillin for URI  ROS: Denies fever, malais, weight loss, blurry vision, decreased visual acuity, cough, sputum, SOB, hemoptysis, pleuritic pain, palpitaitons, heartburn, abdominal pain, melena, lower extremity edema, claudication, or rash.  All other systems reviewed and negative  General: Affect appropriate Healthy:  appears stated age HEENT: normal Neck supple with no adenopathy JVP normal no bruits no thyromegaly Lungs clear with no wheezing and good diaphragmatic motion Heart:  S1/S2 no murmur, no rub, gallop or click PMI normal Abdomen: benighn, BS positve, no tenderness, no AAA no bruit.  No HSM or HJR Distal pulses intact with no bruits No edema Neuro non-focal Skin warm and dry drug rash on arms, torso and abdomen No muscular weakness   Current Outpatient Prescriptions  Medication Sig Dispense Refill  . ALPRAZolam (XANAX) 0.5 MG tablet 1/2 to 1 tablet daily PRN anxiety  30 tablet  0  . amoxicillin-clavulanate (AUGMENTIN) 875-125 MG per tablet Take 1 tablet by mouth 2 (two) times daily.  20 tablet  0  . esomeprazole (NEXIUM) 40 MG capsule Take 40 mg by mouth daily before breakfast.       No current facility-administered medications for this visit.   Facility-Administered Medications Ordered in Other Visits  Medication Dose Route Frequency Provider Last Rate Last Dose  . albuterol (PROVENTIL) (5 MG/ML) 0.5% nebulizer solution 2.5 mg  2.5 mg Nebulization Q6H Ethelda Chick, MD        Allergies  Review of patient's allergies indicates no known allergies.  Electrocardiogram:  Assessment and Plan

## 2012-01-12 NOTE — Telephone Encounter (Signed)
PT STATES HE HAD GONE TO SEE HIS CARDIO AND HAVE A RASH. WAS TOLD THAT HE IS ALLERGIC TO THE AMOXICILLIN HE WAS GIVEN AND EVEN THOUGH HE HAD TAKEN ONE THIS MORNING THE DR WANTED TO STOP AND GIVE [place] A CALL. PLEASE CALL [phone]   [facility] ON [address]

## 2012-01-19 ENCOUNTER — Other Ambulatory Visit (HOSPITAL_COMMUNITY): Payer: BC Managed Care – PPO

## 2012-01-19 ENCOUNTER — Encounter (HOSPITAL_COMMUNITY)
Admission: RE | Admit: 2012-01-19 | Discharge: 2012-01-19 | Disposition: A | Discharge status: Home / Self Care | Payer: BC Managed Care – PPO | Source: Ambulatory Visit | Attending: Gastroenterology | Admitting: Gastroenterology

## 2012-01-19 ENCOUNTER — Ambulatory Visit (HOSPITAL_COMMUNITY)
Admission: RE | Admit: 2012-01-19 | Discharge: 2012-01-19 | Disposition: A | Discharge status: Home / Self Care | Payer: BC Managed Care – PPO | Source: Ambulatory Visit | Attending: Gastroenterology | Admitting: Gastroenterology

## 2012-01-19 DIAGNOSIS — R079 Chest pain, unspecified: Secondary | ICD-10-CM | POA: Insufficient documentation

## 2012-01-19 DIAGNOSIS — K219 Gastro-esophageal reflux disease without esophagitis: Secondary | ICD-10-CM | POA: Insufficient documentation

## 2012-01-19 MED ORDER — TECHNETIUM TC 99M MEBROFENIN IV KIT
5.0000 | PACK | Freq: Once | INTRAVENOUS | Status: AC | PRN
  Administered 2012-01-19: 5 via INTRAVENOUS

## 2012-01-19 MED ORDER — SINCALIDE 5 MCG IJ SOLR
0.0200 ug/kg | Freq: Once | INTRAMUSCULAR | Status: AC
  Administered 2012-01-19: 1.75 ug via INTRAVENOUS
  Filled 2012-01-19: qty 5

## 2012-01-19 MED ORDER — SINCALIDE 5 MCG IJ SOLR
INTRAMUSCULAR | Status: AC
  Administered 2012-01-19: 1.75 ug via INTRAVENOUS
  Filled 2012-01-19: qty 5

## 2012-02-02 ENCOUNTER — Encounter: Payer: BC Managed Care – PPO | Admitting: Cardiovascular Disease

## 2012-02-07 NOTE — Progress Notes (Signed)
Reviewed and agree.

## 2012-02-17 ENCOUNTER — Ambulatory Visit (INDEPENDENT_AMBULATORY_CARE_PROVIDER_SITE_OTHER): Payer: BC Managed Care – PPO | Admitting: Cardiovascular Disease

## 2012-02-17 DIAGNOSIS — R079 Chest pain, unspecified: Secondary | ICD-10-CM

## 2012-02-17 NOTE — Progress Notes (Signed)
Exercise Treadmill Test  Pre-Exercise Testing Evaluation Rhythm: normal sinus  Rate: 77                 Test  Exercise Tolerance Test Ordering MD: Charlton Haws, MD  Interpreting MD: Charlton Haws, MD  Unique Test No: 1  Treadmill:  1  Indication for ETT: chest pain - rule out ischemia  Contraindication to ETT: No   Stress Modality: exercise - treadmill  Cardiac Imaging Performed: non   Protocol: standard Bruce - maximal  Max BP:  139/55  Max MPHR (bpm):  179 85% MPR (bpm):  152  MPHR obtained (bpm):  164 % MPHR obtained:  91  Reached 85% MPHR (min:sec):  7:53 Total Exercise Time (min-sec):  8:30  Workload in METS:  14.3 Borg Scale: 18  Reason ETT Terminated:  fatigue    ST Segment Analysis At Rest: normal ST segments - no evidence of significant ST depression With Exercise: no evidence of significant ST depression  Other Information Arrhythmia:  No Angina during ETT:  absent (0) Quality of ETT:  diagnostic  ETT Interpretation:  normal - no evidence of ischemia by ST analysis  Comments: Normal ETT  Recommendations: Weight loss

## 2012-04-03 DIAGNOSIS — K420 Umbilical hernia with obstruction, without gangrene: Secondary | ICD-10-CM

## 2012-04-03 HISTORY — DX: Umbilical hernia with obstruction, without gangrene: K42.0

## 2012-04-17 ENCOUNTER — Other Ambulatory Visit: Payer: Self-pay

## 2012-06-14 ENCOUNTER — Encounter (INDEPENDENT_AMBULATORY_CARE_PROVIDER_SITE_OTHER): Payer: Self-pay | Admitting: General Surgery

## 2012-06-14 ENCOUNTER — Ambulatory Visit (INDEPENDENT_AMBULATORY_CARE_PROVIDER_SITE_OTHER): Payer: BC Managed Care – PPO | Admitting: General Surgery

## 2012-06-14 VITALS — BP 120/70 | HR 72 | Temp 98.0°F | Resp 18 | Ht 65.5 in | Wt 198.2 lb

## 2012-06-14 DIAGNOSIS — K420 Umbilical hernia with obstruction, without gangrene: Secondary | ICD-10-CM

## 2012-06-14 NOTE — Progress Notes (Signed)
Patient ID: Curtis Arellano, male   DOB: Jul 20, 1970, 42 y.o.   MRN: [id number]  Chief Complaint  Patient presents with  . Umbilical Hernia    HPI MITCHEAL Arellano is a 42 y.o. male.  He was initially referred by Dr. Anselmo Rod for evaluation of an umbilical hernia.  The patient has had some GI complaints. One episode of severe epigastric pain. Some cramps. He underwent extensive workup including upper endoscopy, ultrasound, and hepatobiliary scan all of which were normal. He was placed on Nexium in January which may have helped a little bit. Dr. Loreta Ave noticed an umbilical hernia.  His bowel movements are alternating constipation and then frequent formed bowel movements per day. Sounds a little bit like IBS.   No prior history of hernia. Workup for atypical chest pain by Dr. Burna Forts which was negative. Mild anxiety.  He is Jehovah's Witness. Married. 2 children. Occasional beers. No tobacco. 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 In no distress today  HPI  Past Medical History  Diagnosis Date  . Refusal of blood transfusions as patient is Jehovah's Witness 02/21/11  . GERD (gastroesophageal reflux disease)   . Anxiety     Past Surgical History  Procedure Laterality Date  . Anterior cruciate ligament repair  ~ 1998    left knee  . Fracture surgery  ~ 1992    broken nose  . Nasal septum surgery  ~ 1990  . Eye surgery  ~ 2004    "growth on right eye cut off"    Family History  Problem Relation Age of Onset  . Hypertension Father   . Stroke Maternal Grandmother   . Gallbladder disease Mother     removed  . Anxiety disorder Mother   . Ulcers Maternal Grandfather     per pt stomach ulcer with bleeding    Social History History  Substance Use Topics  . Smoking status: Never Smoker   . Smokeless tobacco: Never Used  . Alcohol Use: 2.4 oz/week    4 Cans of beer per week    Allergies  Allergen Reactions  . Penicillins     Current Outpatient Prescriptions   Medication Sig Dispense Refill  . esomeprazole (NEXIUM) 40 MG capsule Take 40 mg by mouth daily before breakfast.       No current facility-administered medications for this visit.    Review of Systems Review of Systems  Constitutional: Negative for fever, chills and unexpected weight change.  HENT: Negative for hearing loss, congestion, sore throat, trouble swallowing and voice change.   Eyes: Negative for visual disturbance.  Respiratory: Negative for cough and wheezing.   Cardiovascular: Negative for chest pain, palpitations and leg swelling.  Gastrointestinal: Positive for abdominal pain and constipation. Negative for nausea, vomiting, diarrhea, blood in stool, abdominal distention, anal bleeding and rectal pain.  Genitourinary: Negative for hematuria and difficulty urinating.  Musculoskeletal: Negative for arthralgias.  Skin: Negative for rash and wound.  Neurological: Negative for seizures, syncope, weakness and headaches.  Hematological: Negative for adenopathy. Does not bruise/bleed easily.  Psychiatric/Behavioral: Negative for confusion. The patient is nervous/anxious.     Blood pressure 120/70, pulse 72, temperature 98 F (36.7 C), temperature source Oral, resp. rate 18, height 5' 5.5" (1.664 m), weight 198 lb 4 oz (89.926 kg).  Physical Exam Physical Exam  Constitutional: He is oriented to person, place, and time. He appears well-developed and well-nourished. No distress.  HENT:  Head: Normocephalic.  Nose: Nose normal.  Mouth/Throat: No oropharyngeal  exudate.  Eyes: Conjunctivae and EOM are normal. Pupils are equal, round, and reactive to light. Right eye exhibits no discharge. Left eye exhibits no discharge. No scleral icterus.  Neck: Normal range of motion. Neck supple. No JVD present. No tracheal deviation present. No thyromegaly present.  Cardiovascular: Normal rate, regular rhythm, normal heart sounds and intact distal pulses.   No murmur heard. Pulmonary/Chest:  Effort normal and breath sounds normal. No stridor. No respiratory distress. He has no wheezes. He has no rales. He exhibits no tenderness.  Abdominal: Soft. Bowel sounds are normal. He exhibits no distension and no mass. There is no tenderness. There is no rebound and no guarding.  Small umbilical hernia. Partially incarcerated preperitoneal fat. Defect less than 2 cm, probably. No inguinal hernia.  Musculoskeletal: Normal range of motion. He exhibits no edema and no tenderness.  Lymphadenopathy:    He has no cervical adenopathy.  Neurological: He is alert and oriented to person, place, and time. He has normal reflexes. Coordination normal.  Skin: Skin is warm and dry. No rash noted. He is not diaphoretic. No erythema. No pallor.  Psychiatric: He has a normal mood and affect. His behavior is normal. Judgment and thought content normal.    Data Reviewed Imaging studies. Endoscopy report.  Assessment    Incarcerated umbilical hernia. Just starting to become symptomatic.  Probable irritable bowel syndrome.  Jehovah's Witness, declines blood and blood products  Negative workup recently for atypical chest pain  Mild anxiety     Plan    I had a long discussion with the patient regarding his umbilical hernia, anatomical features, natural history, and details of surgical repair and temporary disability issues surrounding that.   I answered numerous questions and he seemed comfortable with these issues.  At this time, he thinks he wants to have this repaired at some time in the future. He will call me back after he has discussed it with his wife and make a decision.  I think that the surgery should be scheduled at an ambulatory surgical Center.        Angelia Mould. Derrell Lolling, M.D., [facility] General and Minimally invasive Surgery Breast and Colorectal Surgery Office:   [phone] Pager:   [phone]  06/14/2012, 4:59 PM

## 2012-06-14 NOTE — Patient Instructions (Signed)
You have an umbilical hernia, with some partially incarcerated fat. There is no immediate danger but over time this will progress.  Please call Dr. Jacinto Halim office when you are ready to schedule the surgery.    Umbilical Herniorrhaphy Herniorrhaphy is surgery to repair a hernia. A hernia is the protrusion of a part of an organ through an abdominal opening. An umbilical hernia means that your hernia is in the area around your navel. If the hernia is not repaired, the gap could get bigger. Your intestines or other tissues, such as fat, could get trapped in the gap. This can lead to other health problems, such as blocked intestines. If the hernia is fixed before problems set in, you may be allowed to go home the same day as the surgery (outpatient). LET YOUR CAREGIVER KNOW ABOUT:  Allergies to food or medicine.  Medicines taken, including vitamins, herbs, eyedrops, over-the-counter medicines, and creams.  Use of steroids (by mouth or creams).  Previous problems with anesthetics or numbing medicines.  History of bleeding problems or blood clots.  Previous surgery.  Other health problems, including diabetes and kidney problems.  Possibility of pregnancy, if this applies. RISKS AND COMPLICATIONS  Pain.  Excessive bleeding.  Hematoma. This is a pocket of blood that collects under the surgery site.  Infection at the surgery site.  Numbness at the surgery site.  Swelling and bruising.  Blood clots.  Intestinal damage (rare).  Scarring.  Skin damage.  Development of another hernia. This may require another surgery. BEFORE THE PROCEDURE  Ask your caregiver about changing or stopping your regular medicines. You may need to stop taking aspirin, nonsteroidal anti-inflammatory drugs (NSAIDs), vitamin E, and blood thinners as early as 2 weeks before the procedure.  Do not eat or drink for 8 hours before the procedure, or as directed by your caregiver.  You might be asked to  shower or wash with an antibacterial soap before the procedure.  Wear comfortable clothes that will be easy to put on after the procedure. PROCEDURE You will be given an intravenous (IV) tube. A needle will be inserted in your arm. Medicine will flow directly into your body through this needle. You might be given medicine to help you relax (sedative). You will be given medicine that numbs the area (local anesthetic) or medicine that makes you sleep (general anesthetic). If you have open surgery:  The surgeon will make a cut (incision) in your abdomen.  The gap in the muscle wall will be repaired. The surgeon may sew the edges together over the gap or use a mesh material to strengthen the area. When mesh is used, the body grows new, strong tissue into and around it. This new tissue closes the gap.  A drain might be put in to remove excess fluid from the body after surgery.  The surgeon will close the incision with stitches, glue, or staples. If you have laparoscopic surgery:  The surgeon will make several small incisions in your abdomen.  A thin, lighted tube (laparoscope) will be inserted into the abdomen through an incision. A camera is attached to the laparoscope that allows the surgeon to see inside the abdomen.  Tools will be inserted through the other incisions to repair the hernia. Usually, mesh is used to cover the gap.  The surgeon will close the incisions with stitches. AFTER THE PROCEDURE  You will be taken to a recovery area. A nurse will watch and check your progress.  When you are awake, feeling well,  and taking fluids well, you may be allowed to go home. In some cases, you may need to stay overnight in the hospital.  Arrange for someone to drive you home. Document Released: 05/16/2008 Document Revised: 08/19/2011 Document Reviewed: 05/21/2011 [facility] Patient Information 2013 [place], [place].

## 2012-06-23 ENCOUNTER — Telehealth: Payer: Self-pay | Admitting: Physician Assistant

## 2012-06-23 NOTE — Telephone Encounter (Signed)
Pt called the answering service today c/o several episodes of palpitations, described as his heart "skipping a beat" and a "muscle spasm." He was at work during the first episode today, and at rest when it happened again. He felt that his leg may have been cramping. He has reduced his caffeine intake, but did drink a small amount today. Denies increased stress, but he has been working a lot lately. He had a normal ETT 02/2012 which was performed for atypical chest pain. Educated on palpitations, likely PVCs/PACs. Discussed possible causes including dehydration, caffeine use and anxiety. Advised to hydrate, completely stop caffeine and employ stress management techniques. He understood and agreed.    Jacqulyn Bath, PA-C 06/23/2012 6:00 PM

## 2012-09-06 ENCOUNTER — Telehealth: Payer: Self-pay | Admitting: Cardiovascular Disease

## 2012-09-06 NOTE — Telephone Encounter (Signed)
New Problem  Pt wants to speak with someone regarding somethings that he has been feeling lately.  He did not give any more information than that.

## 2012-09-06 NOTE — Telephone Encounter (Signed)
PT COMPLAINING   HE FEELS LIKE   HE'S LOSING  HIS BREATH HAS NOTED  THIS   FOR 2-3 MONTHS  CAN TAKE SOME DEEP BREATHS AND  SYMPTOMS GET BETTER   IS VERY ACTIVE WITH  NO COMPLAINTS OF THOSE FEELINGS WITH ACTIVITY  PT AWARE  WILL  FORWARD TO  DR Eden Emms FOR  REVIEW ALSO NOTES  EXTREME ALLERGIES WILL TRY TAKING   ZYRTEC OTC TO SEE IF HELPS WITH SYMPTOMS./CY

## 2012-09-09 NOTE — Telephone Encounter (Signed)
ETT was normal f/u echo to make sure RV and LV function normal Indication dyspnea

## 2012-09-10 ENCOUNTER — Ambulatory Visit (HOSPITAL_COMMUNITY)
Admission: RE | Admit: 2012-09-10 | Discharge: 2012-09-10 | Disposition: A | Discharge status: Home / Self Care | Payer: BC Managed Care – PPO | Source: Ambulatory Visit | Attending: Cardiovascular Disease | Admitting: Cardiovascular Disease

## 2012-09-10 ENCOUNTER — Other Ambulatory Visit: Payer: Self-pay | Admitting: *Deleted

## 2012-09-10 DIAGNOSIS — R0600 Dyspnea, unspecified: Secondary | ICD-10-CM

## 2012-09-10 DIAGNOSIS — F411 Generalized anxiety disorder: Secondary | ICD-10-CM | POA: Insufficient documentation

## 2012-09-10 DIAGNOSIS — K219 Gastro-esophageal reflux disease without esophagitis: Secondary | ICD-10-CM | POA: Insufficient documentation

## 2012-09-10 DIAGNOSIS — I517 Cardiomegaly: Secondary | ICD-10-CM

## 2012-09-10 DIAGNOSIS — R0989 Other specified symptoms and signs involving the circulatory and respiratory systems: Secondary | ICD-10-CM | POA: Insufficient documentation

## 2012-09-10 DIAGNOSIS — R079 Chest pain, unspecified: Secondary | ICD-10-CM | POA: Insufficient documentation

## 2012-09-10 DIAGNOSIS — R0609 Other forms of dyspnea: Secondary | ICD-10-CM | POA: Insufficient documentation

## 2012-09-10 NOTE — Telephone Encounter (Signed)
PT AWARE   ECHO SCHEDULED  FOR  TODAY  AT  2:00 PM

## 2012-09-10 NOTE — Progress Notes (Signed)
*  PRELIMINARY RESULTS* Echocardiogram 2D Echocardiogram has been performed.  Curtis Arellano 09/10/2012, 4:22 PM

## 2012-09-13 ENCOUNTER — Telehealth: Payer: Self-pay | Admitting: Cardiovascular Disease

## 2012-09-13 NOTE — Telephone Encounter (Signed)
New problem ° ° °Per pt returning your call °

## 2012-09-13 NOTE — Telephone Encounter (Signed)
Follow up ° ° °Pt states he is returning your call  °

## 2012-09-13 NOTE — Telephone Encounter (Signed)
PT AWARE OF ECHO RESULTS./CY 

## 2013-01-05 ENCOUNTER — Ambulatory Visit (INDEPENDENT_AMBULATORY_CARE_PROVIDER_SITE_OTHER): Payer: 59 | Admitting: Emergency Medicine

## 2013-01-05 ENCOUNTER — Telehealth: Payer: Self-pay | Admitting: Physician Assistant

## 2013-01-05 VITALS — BP 128/80 | HR 87 | Temp 98.2°F | Resp 17 | Ht 66.5 in | Wt 207.0 lb

## 2013-01-05 DIAGNOSIS — R079 Chest pain, unspecified: Secondary | ICD-10-CM

## 2013-01-05 DIAGNOSIS — K219 Gastro-esophageal reflux disease without esophagitis: Secondary | ICD-10-CM

## 2013-01-05 MED ORDER — SUCRALFATE 1 G PO TABS: ORAL_TABLET | ORAL | Status: DC

## 2013-01-05 MED ORDER — ESOMEPRAZOLE MAGNESIUM 40 MG PO CPDR: 40.0000 mg | DELAYED_RELEASE_CAPSULE | Freq: Every day | ORAL | Status: DC

## 2013-01-05 NOTE — Telephone Encounter (Signed)
Pt called after hours with question about chest pain. Was crouching underneath the sink for a little while. When he stood up, has had sharp pain under his sternum and remains uncomfortable. It was tough to sit down or stand up. It went away then came back multiple times since 6pm and now continues to hurt to the right side. He was wondering what to do. I told him it is difficult to eval chest pain over the phone - it doesn't sound cardiac in etiology but I cannot rule out anything with just talking to him on the phone. I advised that since his symptoms have persisted and are still bothering him that he should proceed to urgent care to be safe. I told him he could also go to the ER but I think UC is a reasonable place to start for basic EKG, VS. He verbalized understanding and gratitude. Dayna Dunn PA-C

## 2013-01-05 NOTE — Patient Instructions (Signed)
Gastroesophageal Reflux Disease, Adult  Gastroesophageal reflux disease (GERD) happens when acid from your stomach flows up into the esophagus. When acid comes in contact with the esophagus, the acid causes soreness (inflammation) in the esophagus. Over time, GERD may create small holes (ulcers) in the lining of the esophagus.  CAUSES   · Increased body weight. This puts pressure on the stomach, making acid rise from the stomach into the esophagus.  · Smoking. This increases acid production in the stomach.  · Drinking alcohol. This causes decreased pressure in the lower esophageal sphincter (valve or ring of muscle between the esophagus and stomach), allowing acid from the stomach into the esophagus.  · Late evening meals and a full stomach. This increases pressure and acid production in the stomach.  · A malformed lower esophageal sphincter.  Sometimes, no cause is found.  SYMPTOMS   · Burning pain in the lower part of the mid-chest behind the breastbone and in the mid-stomach area. This may occur twice a week or more often.  · Trouble swallowing.  · Sore throat.  · Dry cough.  · Asthma-like symptoms including chest tightness, shortness of breath, or wheezing.  DIAGNOSIS   Your caregiver may be able to diagnose GERD based on your symptoms. In some cases, X-rays and other tests may be done to check for complications or to check the condition of your stomach and esophagus.  TREATMENT   Your caregiver may recommend over-the-counter or prescription medicines to help decrease acid production. Ask your caregiver before starting or adding any new medicines.   HOME CARE INSTRUCTIONS   · Change the factors that you can control. Ask your caregiver for guidance concerning weight loss, quitting smoking, and alcohol consumption.  · Avoid foods and drinks that make your symptoms worse, such as:  · Caffeine or alcoholic drinks.  · Chocolate.  · Peppermint or mint flavorings.  · Garlic and onions.  · Spicy foods.  · Citrus fruits,  such as oranges, lemons, or limes.  · Tomato-based foods such as sauce, chili, salsa, and pizza.  · Fried and fatty foods.  · Avoid lying down for the 3 hours prior to your bedtime or prior to taking a nap.  · Eat small, frequent meals instead of large meals.  · Wear loose-fitting clothing. Do not wear anything tight around your waist that causes pressure on your stomach.  · Raise the head of your bed 6 to 8 inches with wood blocks to help you sleep. Extra pillows will not help.  · Only take over-the-counter or prescription medicines for pain, discomfort, or fever as directed by your caregiver.  · Do not take aspirin, ibuprofen, or other nonsteroidal anti-inflammatory drugs (NSAIDs).  SEEK IMMEDIATE MEDICAL CARE IF:   · You have pain in your arms, neck, jaw, teeth, or back.  · Your pain increases or changes in intensity or duration.  · You develop nausea, vomiting, or sweating (diaphoresis).  · You develop shortness of breath, or you faint.  · Your vomit is green, yellow, black, or looks like coffee grounds or blood.  · Your stool is red, bloody, or black.  These symptoms could be signs of other problems, such as heart disease, gastric bleeding, or esophageal bleeding.  MAKE SURE YOU:   · Understand these instructions.  · Will watch your condition.  · Will get help right away if you are not doing well or get worse.  Document Released: 11/27/2004 Document Revised: 05/12/2011 Document Reviewed: 09/06/2010  [employer]® Patient   Information ©2014 [employer].

## 2013-01-05 NOTE — Progress Notes (Signed)
[facility] [address] [place] [address] [id number]  Date:  01/05/2013   Name:  Curtis Arellano   DOB:  12-01-1970   MRN:  [id number]  PCP:  Dow Adolph, MD    Chief Complaint: Chest Pain   History of Present Illness:  ARKIN IMRAN is a 42 y.o. very pleasant male patient who presents with the following:  History of reflux and GERD.  Previously evaluated by echo and treadmill and were normal per patient.  Has stopped his H2 blocker after five months with no direction as he felt"good".  Today had pain in his epigastrium after bending over to get something from a cabinet.  Developed chest pain on arising that persisted for a period of several hours.  No relief with various OTC home remedies.  Pain not relenting for hours but gone on arrival in office.  Non radiating.  No associated nausea or vomiting, rapid heartbeat, sweat, etc.  Non smoker no history of elevated cholesterol, DM, or HBP.  No improvement with over the counter medications or other home remedies. Denies other complaint or health concern today.   Patient Active Problem List   Diagnosis Date Noted  . Umbilical hernia with obstruction 06/14/2012  . Chest pain 01/12/2012  . Rash 01/12/2012  . GERD (gastroesophageal reflux disease)   . Anxiety   . Refusal of blood transfusions as patient is Jehovah's Witness 02/21/2011    Past Medical History  Diagnosis Date  . Refusal of blood transfusions as patient is Jehovah's Witness 02/21/11  . GERD (gastroesophageal reflux disease)   . Anxiety     Past Surgical History  Procedure Laterality Date  . Anterior cruciate ligament repair  ~ 1998    left knee  . Fracture surgery  ~ 1992    broken nose  . Nasal septum surgery  ~ 1990  . Eye surgery  ~ 2004    "growth on right eye cut off"    History  Substance Use Topics  . Smoking status: Never Smoker   . Smokeless tobacco: Never Used  . Alcohol Use: 2.4 oz/week    4 Cans of beer  per week    Family History  Problem Relation Age of Onset  . Hypertension Father   . Stroke Maternal Grandmother   . Gallbladder disease Mother     removed  . Anxiety disorder Mother   . Ulcers Maternal Grandfather     per pt stomach ulcer with bleeding    Allergies  Allergen Reactions  . Penicillins     Medication list has been reviewed and updated.  Current Outpatient Prescriptions on File Prior to Visit  Medication Sig Dispense Refill  . esomeprazole (NEXIUM) 40 MG capsule Take 40 mg by mouth daily before breakfast.       No current facility-administered medications on file prior to visit.    Review of Systems:  As per HPI, otherwise negative.    Physical Examination: Filed Vitals:   01/05/13 1926  BP: 128/80  Pulse: 87  Temp: 98.2 F (36.8 C)  Resp: 17   Filed Vitals:   01/05/13 1926  Height: 5' 6.5" (1.689 m)  Weight: 207 lb (93.895 kg)   Body mass index is 32.91 kg/(m^2). Ideal Body Weight: Weight in (lb) to have BMI = 25: 156.9  GEN: WDWN, NAD, Non-toxic, A & O x 3 HEENT: Atraumatic, Normocephalic. Neck supple. No masses, No LAD. Ears and Nose: No external deformity. CV: RRR, No M/G/R.  No JVD. No thrill. No extra heart sounds. PULM: CTA B, no wheezes, crackles, rhonchi. No retractions. No resp. distress. No accessory muscle use. ABD: S, NT, ND, +BS. No rebound. No HSM. EXTR: No c/c/e NEURO Normal gait.  PSYCH: Normally interactive. Conversant. Not depressed or anxious appearing.  Calm demeanor.    Assessment and Plan: Chest pain GERD Resume PPI Carafate   Signed,  Phillips Odor, MD

## 2013-01-06 ENCOUNTER — Other Ambulatory Visit: Payer: Self-pay

## 2013-02-23 ENCOUNTER — Telehealth: Payer: Self-pay

## 2013-03-31 ENCOUNTER — Encounter: Payer: Self-pay | Admitting: Family Medicine

## 2013-03-31 ENCOUNTER — Ambulatory Visit (INDEPENDENT_AMBULATORY_CARE_PROVIDER_SITE_OTHER): Payer: 59 | Admitting: Family Medicine

## 2013-03-31 VITALS — BP 134/97 | HR 69 | Temp 98.1°F | Resp 16 | Ht 66.5 in | Wt 202.0 lb

## 2013-03-31 DIAGNOSIS — Z0000 Encounter for general adult medical examination without abnormal findings: Secondary | ICD-10-CM

## 2013-03-31 DIAGNOSIS — E785 Hyperlipidemia, unspecified: Secondary | ICD-10-CM

## 2013-03-31 DIAGNOSIS — E669 Obesity, unspecified: Secondary | ICD-10-CM | POA: Insufficient documentation

## 2013-03-31 LAB — COMPLETE METABOLIC PANEL WITH GFR
ALK PHOS: 66 U/L (ref 39–117)
ALT: 24 U/L (ref 0–53)
AST: 16 U/L (ref 0–37)
Albumin: 4.8 g/dL (ref 3.5–5.2)
BILIRUBIN TOTAL: 0.7 mg/dL (ref 0.2–1.2)
BUN: 21 mg/dL (ref 6–23)
CO2: 29 meq/L (ref 19–32)
CREATININE: 0.94 mg/dL (ref 0.50–1.35)
Calcium: 10 mg/dL (ref 8.4–10.5)
Chloride: 100 mEq/L (ref 96–112)
GLUCOSE: 84 mg/dL (ref 70–99)
Potassium: 4.5 mEq/L (ref 3.5–5.3)
SODIUM: 137 meq/L (ref 135–145)
TOTAL PROTEIN: 7.6 g/dL (ref 6.0–8.3)

## 2013-03-31 LAB — LIPID PANEL
CHOL/HDL RATIO: 5.5 ratio
Cholesterol: 262 mg/dL — ABNORMAL HIGH (ref 0–200)
HDL: 48 mg/dL (ref >39)
LDL Cholesterol: 182 mg/dL — ABNORMAL HIGH (ref 0–99)
TRIGLYCERIDES: 158 mg/dL — AB (ref <150)
VLDL: 32 mg/dL (ref 0–40)

## 2013-03-31 NOTE — Patient Instructions (Signed)
Keeping you healthy  Get these tests  Blood pressure- Have your blood pressure checked once a year by your healthcare provider.  Normal blood pressure is 120/80.  Weight- Have your body mass index (BMI) calculated to screen for obesity.  BMI is a measure of body fat based on height and weight. You can also calculate your own BMI at https://www.west-esparza.com/www.nhlbisupport.com/bmi/.  Cholesterol- Have your cholesterol checked regularly starting at age 43, sooner may be necessary if you have diabetes, high blood pressure, if a family member developed heart diseases at an early age or if you smoke.   Chlamydia, HIV, and other sexual transmitted disease- Get screened each year until the age of 43 then within three months of each new sexual partner.  Diabetes- Have your blood sugar checked regularly if you have high blood pressure, high cholesterol, a family history of diabetes or if you are overweight.  Get these vaccines  Flu shot- Every fall.  Tetanus shot- Every 10 years.  Menactra- Single dose; prevents meningitis.  Take these steps  Don't smoke- If you do smoke, ask your healthcare provider about quitting. For tips on how to quit, go to www.smokefree.gov or call 1-800-QUIT-NOW.  Be physically active- Exercise 5 days a week for at least 30 minutes.  If you are not already physically active start slow and gradually work up to 30 minutes of moderate physical activity.  Examples of moderate activity include walking briskly, mowing the yard, dancing, swimming bicycling, etc.  Eat a healthy diet- Eat a variety of healthy foods such as fruits, vegetables, low fat milk, low fat cheese, yogurt, lean meats, poultry, fish, beans, tofu, etc.  For more information on healthy eating, go to www.thenutritionsource.org  Drink alcohol in moderation- Limit alcohol intake two drinks or less a day.  Never drink and drive.  Dentist- Brush and floss teeth twice daily; visit your dentis twice a year.  Depression-Your emotional  health is as important as your physical health.  If you're feeling down, losing interest in things you normally enjoy please talk with your healthcare provider.  Gun Safety- If you keep a gun in your home, keep it unloaded and with the safety lock on.  Bullets should be stored separately.  Helmet use- Always wear a helmet when riding a motorcycle, bicycle, rollerblading or skateboarding.  Safe sex- If you may be exposed to a sexually transmitted infection, use a condom  Seat belts- Seat bels can save your life; always wear one.  Smoke/Carbon Monoxide detectors- These detectors need to be installed on the appropriate level of your home.  Replace batteries at least once a year.  Skin Cancer- When out in the sun, cover up and use sunscreen SPF 15 or higher.  Violence- If anyone is threatening or hurting you, please tell your healthcare provider.    Fiber Content in Foods Drinking plenty of fluids and consuming foods high in fiber can help with constipation. See the list below for the fiber content of some common foods. Starches and Grains / Dietary Fiber (g)  Cheerios, 1 cup / 3 g  Kellogg's Corn Flakes, 1 cup / 0.7 g  Rice Krispies, 1  cup / 0.3 g  Quaker Oat Life Cereal,  cup / 2.1 g  Oatmeal, instant (cooked),  cup / 2 g  Kellogg's Frosted Mini Wheats, 1 cup / 5.1 g  Rice, brown, long-grain (cooked), 1 cup / 3.5 g  Rice, white, long-grain (cooked), 1 cup / 0.6 g  Macaroni, cooked, enriched, 1 cup /  2.5 g Legumes / Dietary Fiber (g)  Beans, baked, canned, plain or vegetarian,  cup / 5.2 g  Beans, kidney, canned,  cup / 6.8 g  Beans, pinto, dried (cooked),  cup / 7.7 g  Beans, pinto, canned,  cup / 5.5 g Breads and Crackers / Dietary Fiber (g)  Graham crackers, plain or honey, 2 squares / 0.7 g  Saltine crackers, 3 squares / 0.3 g  Pretzels, plain, salted, 10 pieces / 1.8 g  Bread, whole-wheat, 1 slice / 1.9 g  Bread, white, 1 slice / 0.7 g  Bread,  raisin, 1 slice / 1.2 g  Bagel, plain, 3 oz / 2 g  Tortilla, flour, 1 oz / 0.9 g  Tortilla, corn, 1 small / 1.5 g  Bun, hamburger or hotdog, 1 small / 0.9 g Fruits / Dietary Fiber (g)  Apple, raw with skin, 1 medium / 4.4 g  Applesauce, sweetened,  cup / 1.5 g  Banana,  medium / 1.5 g  Grapes, 10 grapes / 0.4 g  Orange, 1 small / 2.3 g  Raisin, 1.5 oz / 1.6 g  Melon, 1 cup / 1.4 g Vegetables / Dietary Fiber (g)  Green beans, canned,  cup / 1.3 g  Carrots (cooked),  cup / 2.3 g  Broccoli (cooked),  cup / 2.8 g  Peas, frozen (cooked),  cup / 4.4 g  Potatoes, mashed,  cup / 1.6 g  Lettuce, 1 cup / 0.5 g  Corn, canned,  cup / 1.6 g  Tomato,  cup / 1.1 g Document Released: 07/06/2006 Document Revised: 05/12/2011 Document Reviewed: 08/31/2006 Exit[employer] Patient Information 2014 [place], [place].

## 2013-03-31 NOTE — Progress Notes (Signed)
Subjective:    Patient ID: Curtis Arellano, male    DOB: March 17, 1970, 43 y.o.   MRN: [id number]  HPI  This 43 y.o. Cauc male is here for CPE. He has recently embarked upon fitness program w/ goal being weight= 165 lbs. In the past, he has had episodic chest and epig pain; eval w/ GI revealed GERD. Pt is asymptomatic w/ dietary changes and off all medications. Pt has umbilical hernia which has been evaluated by general surgeon; pt has discussed surgery and plans to return to set up procedure date.  Patient Active Problem List   Diagnosis Date Noted  . Obesity, unspecified 03/31/2013  . Umbilical hernia with obstruction 06/14/2012  . Chest pain 01/12/2012  . GERD (gastroesophageal reflux disease)   . Anxiety   . Refusal of blood transfusions as patient is Jehovah's Witness 02/21/2011   PMHx, Surg Hx, Soc and Fam Hx reviewed.   Review of Systems  HENT: Positive for sinus pressure.   Musculoskeletal: Positive for arthralgias, neck pain and neck stiffness.  All other systems reviewed and are negative.      Objective:   Physical Exam  Nursing note and vitals reviewed. Constitutional: He is oriented to person, place, and time. Vital signs are normal. He appears well-developed. No distress.  HENT:  Head: Normocephalic.  Right Ear: Hearing, tympanic membrane, external ear and ear canal normal.  Left Ear: Hearing, tympanic membrane, external ear and ear canal normal.  Nose: Nose normal. No nasal deformity or septal deviation.  Mouth/Throat: Uvula is midline, oropharynx is clear and moist and mucous membranes are normal. No oral lesions. Normal dentition.  Eyes: Conjunctivae, EOM and lids are normal. Pupils are equal, round, and reactive to light. No scleral icterus.  Fundoscopic exam:      The right eye shows no papilledema. The right eye shows red reflex.       The left eye shows no papilledema. The left eye shows red reflex.  Neck: Trachea normal, normal range of motion and full  passive range of motion without pain. Neck supple. No JVD present. No spinous process tenderness and no muscular tenderness present. No mass and no thyromegaly present.  Cardiovascular: Normal rate, regular rhythm, S1 normal, S2 normal, normal heart sounds and normal pulses.   No extrasystoles are present. PMI is not displaced.  Exam reveals no gallop and no friction rub.   No murmur heard. Pulmonary/Chest: Effort normal and breath sounds normal. No respiratory distress. He has no decreased breath sounds. He has no wheezes.  Abdominal: Soft. Normal appearance and bowel sounds are normal. He exhibits no distension, no pulsatile midline mass and no mass. There is no hepatosplenomegaly. There is no tenderness. There is no guarding and no CVA tenderness. A hernia is present. Hernia confirmed positive in the ventral area.  Peri-umbilical hernia present.  Genitourinary:  Deferred.  Musculoskeletal: Normal range of motion. He exhibits no edema and no tenderness.  Lymphadenopathy:       Head (right side): No submental, no submandibular, no tonsillar, no posterior auricular and no occipital adenopathy present.       Head (left side): No submental, no submandibular, no tonsillar, no posterior auricular and no occipital adenopathy present.    He has no cervical adenopathy.    He has no axillary adenopathy.       Right: No inguinal and no supraclavicular adenopathy present.       Left: No inguinal and no supraclavicular adenopathy present.  Neurological: He is alert  and oriented to person, place, and time. He has normal strength and normal reflexes. He displays no atrophy. No cranial nerve deficit or sensory deficit. He exhibits normal muscle tone. He displays a negative Romberg sign. Coordination and gait normal.  Skin: Skin is warm, dry and intact. No ecchymosis, no lesion and no rash noted. He is not diaphoretic. No cyanosis or erythema. No pallor. Nails show no clubbing.  Psychiatric: He has a normal mood  and affect. His speech is normal and behavior is normal. Judgment and thought content normal. Cognition and memory are normal.       Assessment & Plan:  Routine general medical examination at a health care facility - Plan: COMPLETE METABOLIC PANEL WITH GFR  Dyslipidemia (high LDL; low HDL) - Plan: Lipid panel (nonfasting)  Obesity, unspecified- Encouraged healthier lifestyle and advised that it may take 12 months to reach goal weight.

## 2013-04-01 ENCOUNTER — Encounter: Payer: Self-pay | Admitting: Family Medicine

## 2013-04-05 ENCOUNTER — Other Ambulatory Visit (INDEPENDENT_AMBULATORY_CARE_PROVIDER_SITE_OTHER): Payer: Self-pay | Admitting: General Surgery

## 2013-04-11 ENCOUNTER — Encounter (HOSPITAL_COMMUNITY): Payer: Self-pay

## 2013-04-12 NOTE — Pre-Procedure Instructions (Signed)
Eligah EastWeldon A Fischbach  04/12/2013   Your procedure is scheduled on:  Wednesday, Feb 25th   [facility]  2 * 3 at 10:30 AM.  Call this number if you have problems the morning of surgery: [phone]   Remember:   Do not eat food or drink liquids after midnight Tuesday.   Take these medicines the morning of surgery with A SIP OF WATER:  NONE   Do not wear jewelry-no rings or watches.  Do not wear lotions, powders, or colognes.    Men may shave face and neck.  Do not bring valuables to the hospital.  [facility] is not responsible for any belongings or valuables.               Name and phone number of your driver:     Special Instructions: [place] - Preparing for Surgery  Before surgery, you can play an important role.  Because skin is not sterile, your skin needs to be as free of germs as possible.  You can reduce the number of germs on you skin by washing with CHG (chlorahexidine gluconate) soap before surgery.  CHG is an antiseptic cleaner which kills germs and bonds with the skin to continue killing germs even after washing.  Please DO NOT use if you have an allergy to CHG or antibacterial soaps.  If your skin becomes reddened/irritated stop using the CHG and inform your nurse when you arrive at Short Stay.  Do not shave (including legs and underarms) for at least 48 hours prior to the first CHG shower.  You may shave your face.  Please follow these instructions carefully:   1.  Shower with CHG Soap the night before surgery and the morning of Surgery.  2.  If you choose to wash your hair, wash your hair first as usual with your normal shampoo.  3.  After you shampoo, rinse your hair and body thoroughly to remove the Shampoo.  4.  Use CHG as you would any other liquid soap.  You can apply chg directly to the skin and wash gently with scrungie or a clean washcloth.  5.  Apply the CHG Soap to your body ONLY FROM THE NECK DOWN. Do not use on open  wounds or open sores.  Avoid contact with your eyes,       ears, mouth and genitals (private parts).  Wash genitals (private parts)with your normal soap.  6.  Wash thoroughly, paying special attention to the area where your surgery will be performed.  7.  Thoroughly rinse your body with warm water from the neck down.  8.  DO NOT shower/wash with your normal soap after using and rinsing off the CHG Soap.  9.  Pat yourself dry with a clean towel.            10.  Wear clean pajamas.            11.  Place clean sheets on your bed the night of your first shower and do not sleep with pets.                     Day of Surgery  Do not apply any lotions/deoderants the morning of surgery.  Please wear clean clothes to the hospital/surgery center.     Please read over the following fact sheets that you were given: Pain Booklet and Surgical Site Infection Prevention

## 2013-04-13 ENCOUNTER — Encounter (HOSPITAL_COMMUNITY)
Admission: RE | Admit: 2013-04-13 | Discharge: 2013-04-13 | Disposition: A | Discharge status: Home / Self Care | Payer: 59 | Source: Ambulatory Visit | Attending: General Surgery | Admitting: General Surgery

## 2013-04-13 ENCOUNTER — Encounter (HOSPITAL_COMMUNITY): Payer: Self-pay

## 2013-04-13 DIAGNOSIS — Z01812 Encounter for preprocedural laboratory examination: Secondary | ICD-10-CM | POA: Insufficient documentation

## 2013-04-13 HISTORY — DX: Pain in left knee: M25.562

## 2013-04-13 HISTORY — DX: Reserved for inherently not codable concepts without codable children: IMO0001

## 2013-04-13 HISTORY — DX: Umbilical hernia with obstruction, without gangrene: K42.0

## 2013-04-13 LAB — CBC WITH DIFFERENTIAL/PLATELET
BASOS PCT: 0 % (ref 0–1)
Basophils Absolute: 0 10*3/uL (ref 0.0–0.1)
Eosinophils Absolute: 0.2 10*3/uL (ref 0.0–0.7)
Eosinophils Relative: 4 % (ref 0–5)
HEMATOCRIT: 42.9 % (ref 39.0–52.0)
HEMOGLOBIN: 15.6 g/dL (ref 13.0–17.0)
LYMPHS ABS: 1.8 10*3/uL (ref 0.7–4.0)
LYMPHS PCT: 35 % (ref 12–46)
MCH: 31.8 pg (ref 26.0–34.0)
MCHC: 36.4 g/dL — AB (ref 30.0–36.0)
MCV: 87.6 fL (ref 78.0–100.0)
MONO ABS: 0.6 10*3/uL (ref 0.1–1.0)
MONOS PCT: 11 % (ref 3–12)
NEUTROS ABS: 2.5 10*3/uL (ref 1.7–7.7)
NEUTROS PCT: 50 % (ref 43–77)
Platelets: 219 10*3/uL (ref 150–400)
RBC: 4.9 MIL/uL (ref 4.22–5.81)
RDW: 12.6 % (ref 11.5–15.5)
WBC: 5.1 10*3/uL (ref 4.0–10.5)

## 2013-04-13 LAB — COMPREHENSIVE METABOLIC PANEL
ALBUMIN: 3.8 g/dL (ref 3.5–5.2)
ALK PHOS: 70 U/L (ref 39–117)
ALT: 26 U/L (ref 0–53)
AST: 18 U/L (ref 0–37)
BILIRUBIN TOTAL: 0.3 mg/dL (ref 0.3–1.2)
BUN: 20 mg/dL (ref 6–23)
CHLORIDE: 105 meq/L (ref 96–112)
CO2: 25 meq/L (ref 19–32)
CREATININE: 0.99 mg/dL (ref 0.50–1.35)
Calcium: 8.8 mg/dL (ref 8.4–10.5)
GFR calc Af Amer: >90 mL/min (ref >90)
GLUCOSE: 116 mg/dL — AB (ref 70–99)
POTASSIUM: 4.5 meq/L (ref 3.7–5.3)
Sodium: 143 mEq/L (ref 137–147)
Total Protein: 7 g/dL (ref 6.0–8.3)

## 2013-04-14 IMAGING — CR DG CHEST 2V
2 series · 2 of 2 positions shown · non-contrast
Comparison: No priors.

CLINICAL DATA: Cough, chest pain and wheezing.

CHEST - 2 VIEW

[PA]
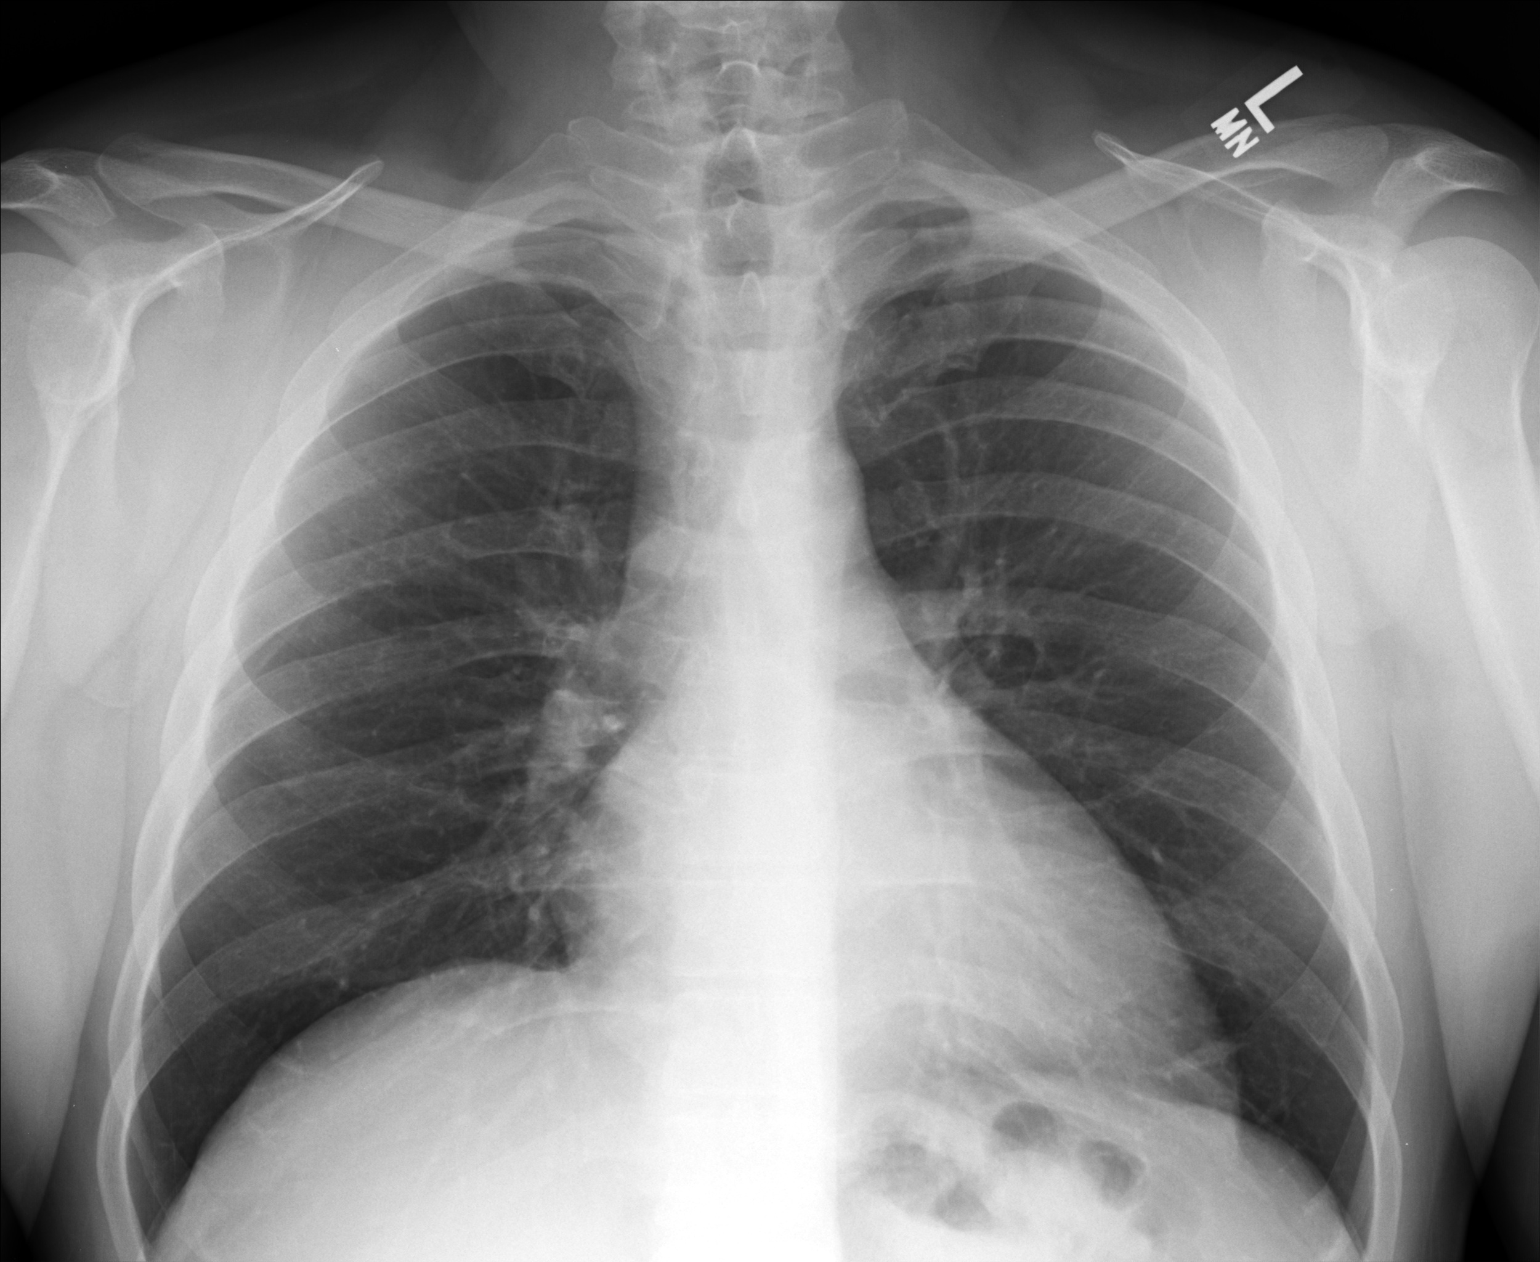

[lateral]
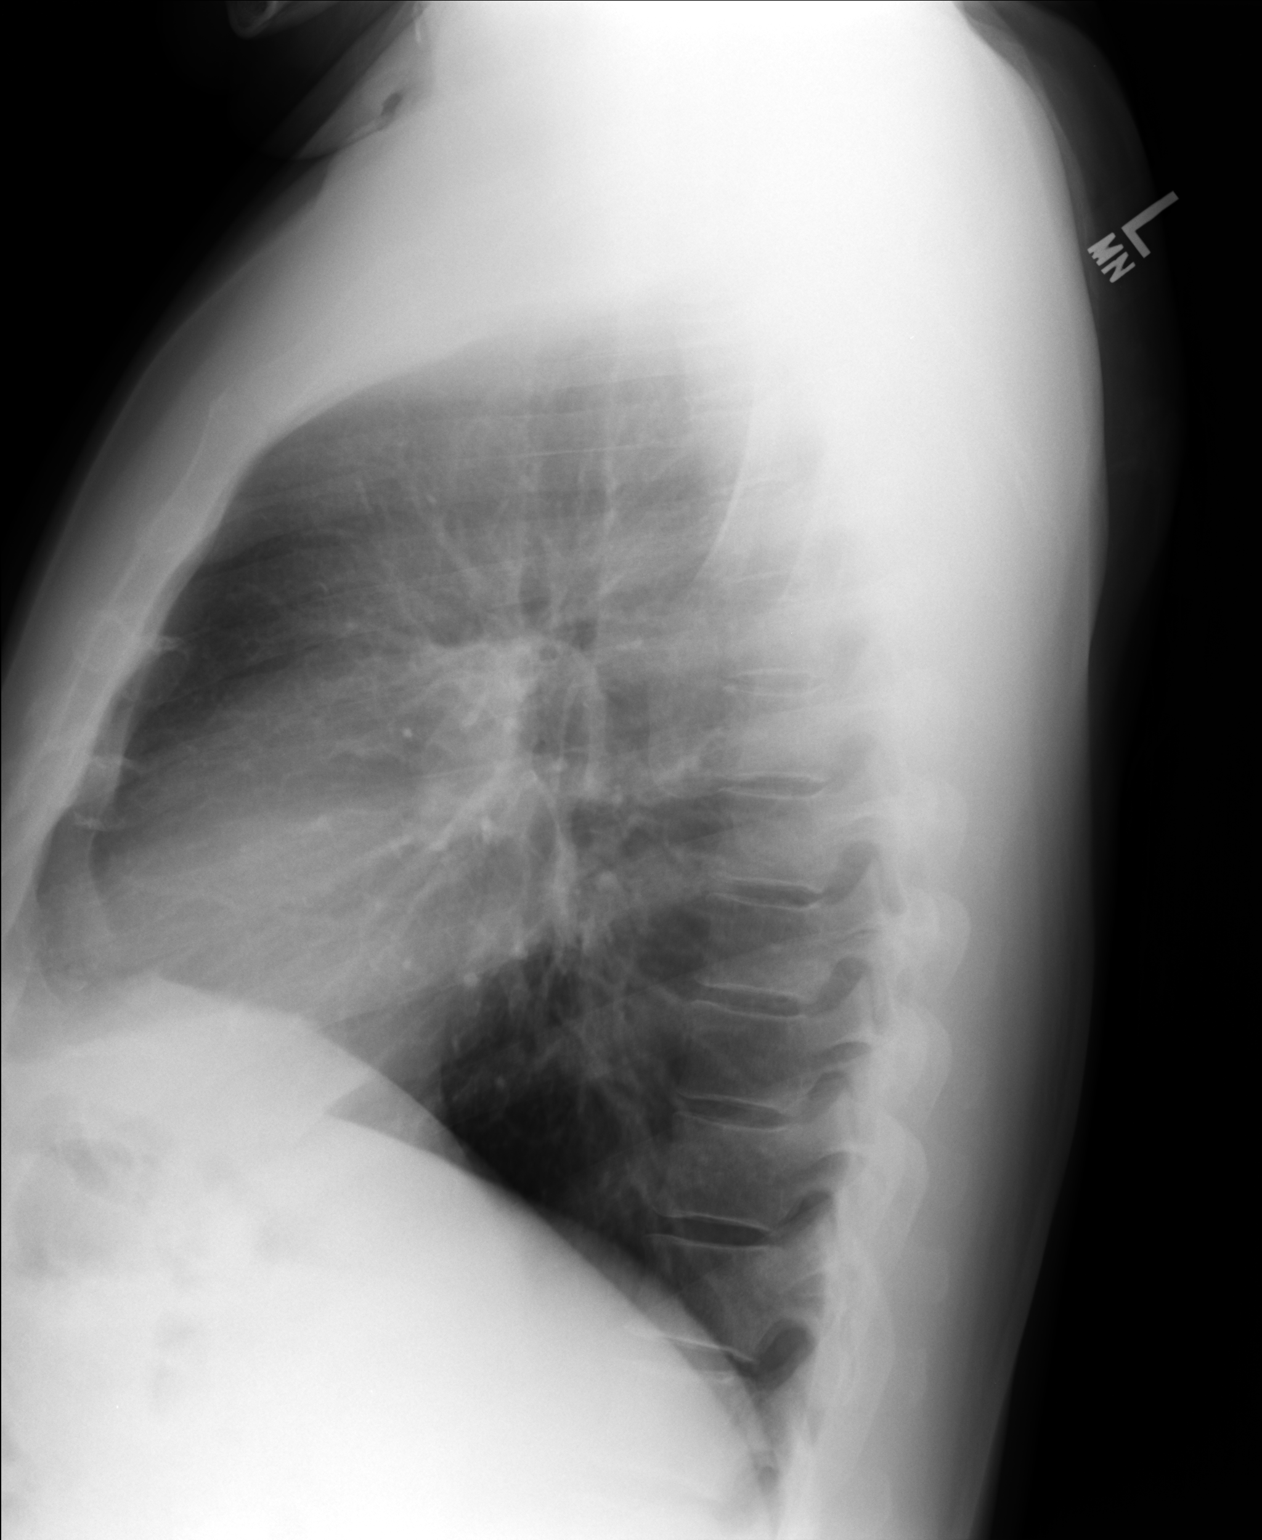

[2 of 2 positions shown; findings below may reference images not displayed]

FINDINGS: Lung volumes are normal.  No consolidative airspace
disease.  No pleural effusions.  No pneumothorax.  No pulmonary
nodule or mass noted.  Pulmonary vasculature and the
cardiomediastinal silhouette are within normal limits.
IMPRESSION: 1. No radiographic evidence of acute cardiopulmonary disease.

## 2013-04-25 NOTE — H&P (Signed)
Curtis Arellano    MRN:  [id number]   Description: 43 year old male  Provider: Ernestene Mention, MD  Department: [facility]                Diagnoses      Umbilical hernia with obstruction    -  Primary      552.1               Current Vitals - Last Recorded      BP Pulse Temp(Src) Resp Ht Wt      120/70 72 98 F (36.7 C) (Oral) 18 5' 5.5" (1.664 m) 198 lb 4 oz (89.926 kg)      BMI32.48 kg/m2                      History and Physical   Ernestene Mention, MD    Status: Signed            Patient ID: Curtis Arellano, male   DOB: July 01, 1970, 43 y.o.   MRN: [id number]             HPI Curtis Arellano is a 42 y.o. male.  He was initially referred by Dr. Anselmo Rod for evaluation of an umbilical hernia.   The patient has had some GI complaints. One episode of severe epigastric pain. Some cramps. He underwent extensive workup including upper endoscopy, ultrasound, and hepatobiliary scan all of which were normal. He was placed on Nexium in January which may have helped a little bit. Dr. Loreta Ave noticed an umbilical hernia.   His bowel movements are alternating constipation and then frequent formed bowel movements per day. Sounds a little bit like IBS.    No prior history of hernia. Workup for atypical chest pain by Dr. Burna Forts which was negative. Mild anxiety.   He is Jehovah's Witness. Married. 2 children. Occasional beers. No tobacco. 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 In no distress today         Past Medical History   Diagnosis  Date   .  Refusal of blood transfusions as patient is Jehovah's Witness  02/21/11   .  GERD (gastroesophageal reflux disease)     .  Anxiety           Past Surgical History   Procedure  Laterality  Date   .  Anterior cruciate ligament repair    ~ 1998       left knee   .  Fracture surgery    ~ 1992       broken nose   .  Nasal septum surgery    ~ 1990   .  Eye surgery    ~ 2004       "growth on right  eye cut off"         Family History   Problem  Relation  Age of Onset   .  Hypertension  Father     .  Stroke  Maternal Grandmother     .  Gallbladder disease  Mother         removed   .  Anxiety disorder  Mother     .  Ulcers  Maternal Grandfather         per pt stomach ulcer with bleeding        Social History History   Substance Use Topics   .  Smoking status:  Never Smoker    .  Smokeless tobacco:  Never Used   .  Alcohol Use:  2.4 oz/week       4 Cans of beer per week         Allergies   Allergen  Reactions   .  Penicillins           Current Outpatient Prescriptions   Medication  Sig  Dispense  Refill   .  esomeprazole (NEXIUM) 40 MG capsule  Take 40 mg by mouth daily before breakfast.                   Review of Systems   Constitutional: Negative for fever, chills and unexpected weight change.  HENT: Negative for hearing loss, congestion, sore throat, trouble swallowing and voice change.   Eyes: Negative for visual disturbance.  Respiratory: Negative for cough and wheezing.   Cardiovascular: Negative for chest pain, palpitations and leg swelling.  Gastrointestinal: Positive for abdominal pain and constipation. Negative for nausea, vomiting, diarrhea, blood in stool, abdominal distention, anal bleeding and rectal pain.  Genitourinary: Negative for hematuria and difficulty urinating.  Musculoskeletal: Negative for arthralgias.  Skin: Negative for rash and wound.  Neurological: Negative for seizures, syncope, weakness and headaches.  Hematological: Negative for adenopathy. Does not bruise/bleed easily.  Psychiatric/Behavioral: Negative for confusion. The patient is nervous/anxious.       Blood pressure 120/70, pulse 72, temperature 98 F (36.7 C), temperature source Oral, resp. rate 18, height 5' 5.5" (1.664 m), weight 198 lb 4 oz (89.926 kg).   Physical Exam   Constitutional: He is oriented to person, place, and time. He appears  well-developed and well-nourished. No distress.  HENT:   Head: Normocephalic.   Nose: Nose normal.   Mouth/Throat: No oropharyngeal exudate.  Eyes: Conjunctivae and EOM are normal. Pupils are equal, round, and reactive to light. Right eye exhibits no discharge. Left eye exhibits no discharge. No scleral icterus.  Neck: Normal range of motion. Neck supple. No JVD present. No tracheal deviation present. No thyromegaly present.  Cardiovascular: Normal rate, regular rhythm, normal heart sounds and intact distal pulses.    No murmur heard. Pulmonary/Chest: Effort normal and breath sounds normal. No stridor. No respiratory distress. He has no wheezes. He has no rales. He exhibits no tenderness.  Abdominal: Soft. Bowel sounds are normal. He exhibits no distension and no mass. There is no tenderness. There is no rebound and no guarding.  Small umbilical hernia. Partially incarcerated preperitoneal fat. Defect less than 2 cm, probably. No inguinal hernia.  Musculoskeletal: Normal range of motion. He exhibits no edema and no tenderness.  Lymphadenopathy:    He has no cervical adenopathy.  Neurological: He is alert and oriented to person, place, and time. He has normal reflexes. Coordination normal.  Skin: Skin is warm and dry. No rash noted. He is not diaphoretic. No erythema. No pallor.  Psychiatric: He has a normal mood and affect. His behavior is normal. Judgment and thought content normal.      Data Reviewed Imaging studies. Endoscopy report.   Assessment    Incarcerated umbilical hernia. Just starting to become symptomatic.   Probable irritable bowel syndrome.   Jehovah's Witness, declines blood and blood products   Negative workup recently for atypical chest pain   Mild anxiety      Plan    I had a long discussion with the patient regarding his umbilical hernia, anatomical features, natural history, and details of surgical repair and temporary disability issues surrounding  that.  I answered numerous questions and he seemed comfortable with these issues.   At this time, he thinks he wants to have this repaired at some time in the future. He will call me back after he has discussed it with his wife and make a decision.   I think that the surgery should be scheduled at an ambulatory surgical Center.           Angelia MouldHaywood M. Derrell LollingIngram, M.D., [facility] General and Minimally invasive Surgery Breast and Colorectal Surgery Office:   [phone] Pager:   [phone]

## 2013-04-26 MED ORDER — VANCOMYCIN HCL IN DEXTROSE 1-5 GM/200ML-% IV SOLN
1000.0000 mg | INTRAVENOUS | Status: AC
  Administered 2013-04-27: 1000 mg via INTRAVENOUS
  Filled 2013-04-26: qty 200

## 2013-04-26 MED ORDER — CHLORHEXIDINE GLUCONATE 4 % EX LIQD
1.0000 "application " | Freq: Once | CUTANEOUS | Status: DC
  Filled 2013-04-26: qty 15

## 2013-04-27 ENCOUNTER — Ambulatory Visit (HOSPITAL_COMMUNITY): Payer: 59 | Admitting: Anesthesiology

## 2013-04-27 ENCOUNTER — Encounter (HOSPITAL_COMMUNITY)
Admission: RE | Disposition: A | Discharge status: Home / Self Care | Payer: Self-pay | Source: Ambulatory Visit | Attending: General Surgery

## 2013-04-27 ENCOUNTER — Encounter (HOSPITAL_COMMUNITY): Payer: 59 | Admitting: Anesthesiology

## 2013-04-27 ENCOUNTER — Ambulatory Visit (HOSPITAL_COMMUNITY)
Admission: RE | Admit: 2013-04-27 | Discharge: 2013-04-27 | Disposition: A | Discharge status: Home / Self Care | Payer: 59 | Source: Ambulatory Visit | Attending: General Surgery | Admitting: General Surgery

## 2013-04-27 DIAGNOSIS — K420 Umbilical hernia with obstruction, without gangrene: Secondary | ICD-10-CM | POA: Diagnosis present

## 2013-04-27 DIAGNOSIS — K429 Umbilical hernia without obstruction or gangrene: Secondary | ICD-10-CM | POA: Insufficient documentation

## 2013-04-27 DIAGNOSIS — K219 Gastro-esophageal reflux disease without esophagitis: Secondary | ICD-10-CM | POA: Insufficient documentation

## 2013-04-27 DIAGNOSIS — F411 Generalized anxiety disorder: Secondary | ICD-10-CM | POA: Insufficient documentation

## 2013-04-27 HISTORY — PX: INSERTION OF MESH: SHX5868

## 2013-04-27 HISTORY — PX: UMBILICAL HERNIA REPAIR: SHX196

## 2013-04-27 SURGERY — REPAIR, HERNIA, UMBILICAL, ADULT
Anesthesia: General | Site: Abdomen

## 2013-04-27 MED ORDER — LIDOCAINE HCL (CARDIAC) 20 MG/ML IV SOLN
INTRAVENOUS | Status: AC
  Filled 2013-04-27: qty 5

## 2013-04-27 MED ORDER — HYDROCODONE-ACETAMINOPHEN 5-325 MG PO TABS: 1.0000 | ORAL_TABLET | ORAL | Status: DC | PRN

## 2013-04-27 MED ORDER — SODIUM CHLORIDE 0.9 % IV SOLN: 250.0000 mL | INTRAVENOUS | Status: DC | PRN

## 2013-04-27 MED ORDER — ARTIFICIAL TEARS OP OINT
TOPICAL_OINTMENT | OPHTHALMIC | Status: AC
  Filled 2013-04-27: qty 3.5

## 2013-04-27 MED ORDER — FENTANYL CITRATE 0.05 MG/ML IJ SOLN
INTRAMUSCULAR | Status: AC
  Administered 2013-04-27: 50 ug via INTRAVENOUS
  Filled 2013-04-27: qty 2

## 2013-04-27 MED ORDER — HYDROCODONE-ACETAMINOPHEN 5-325 MG PO TABS
ORAL_TABLET | ORAL | Status: AC
  Administered 2013-04-27: 2 via ORAL
  Filled 2013-04-27: qty 2

## 2013-04-27 MED ORDER — BUPIVACAINE-EPINEPHRINE 0.5% -1:200000 IJ SOLN
INTRAMUSCULAR | Status: DC | PRN
  Administered 2013-04-27: 10 mL

## 2013-04-27 MED ORDER — ONDANSETRON HCL 4 MG/2ML IJ SOLN
INTRAMUSCULAR | Status: AC
  Filled 2013-04-27: qty 2

## 2013-04-27 MED ORDER — GLYCOPYRROLATE 0.2 MG/ML IJ SOLN
INTRAMUSCULAR | Status: AC
  Filled 2013-04-27: qty 1

## 2013-04-27 MED ORDER — LACTATED RINGERS IV SOLN
INTRAVENOUS | Status: DC
  Administered 2013-04-27: 11:00:00 via INTRAVENOUS

## 2013-04-27 MED ORDER — 0.9 % SODIUM CHLORIDE (POUR BTL) OPTIME
TOPICAL | Status: DC | PRN
  Administered 2013-04-27: 1000 mL

## 2013-04-27 MED ORDER — ACETAMINOPHEN 325 MG PO TABS: 650.0000 mg | ORAL_TABLET | ORAL | Status: DC | PRN

## 2013-04-27 MED ORDER — HYDROMORPHONE HCL PF 1 MG/ML IJ SOLN
0.2500 mg | INTRAMUSCULAR | Status: DC | PRN
  Administered 2013-04-27 (×2): 0.25 mg via INTRAVENOUS

## 2013-04-27 MED ORDER — PROPOFOL 10 MG/ML IV BOLUS
INTRAVENOUS | Status: AC
  Filled 2013-04-27: qty 20

## 2013-04-27 MED ORDER — OXYCODONE HCL 5 MG PO TABS: 5.0000 mg | ORAL_TABLET | ORAL | Status: DC | PRN

## 2013-04-27 MED ORDER — PROPOFOL 10 MG/ML IV BOLUS
INTRAVENOUS | Status: DC | PRN
  Administered 2013-04-27: 200 mg via INTRAVENOUS

## 2013-04-27 MED ORDER — ROCURONIUM BROMIDE 50 MG/5ML IV SOLN
INTRAVENOUS | Status: AC
  Filled 2013-04-27: qty 1

## 2013-04-27 MED ORDER — ACETAMINOPHEN 650 MG RE SUPP: 650.0000 mg | RECTAL | Status: DC | PRN

## 2013-04-27 MED ORDER — FENTANYL CITRATE 0.05 MG/ML IJ SOLN
25.0000 ug | INTRAMUSCULAR | Status: DC | PRN
  Administered 2013-04-27: 50 ug via INTRAVENOUS

## 2013-04-27 MED ORDER — GLYCOPYRROLATE 0.2 MG/ML IJ SOLN
INTRAMUSCULAR | Status: DC | PRN
  Administered 2013-04-27: 0.2 mg via INTRAVENOUS

## 2013-04-27 MED ORDER — ONDANSETRON HCL 4 MG/2ML IJ SOLN
INTRAMUSCULAR | Status: DC | PRN
  Administered 2013-04-27: 4 mg via INTRAVENOUS

## 2013-04-27 MED ORDER — FENTANYL CITRATE 0.05 MG/ML IJ SOLN
INTRAMUSCULAR | Status: DC | PRN
  Administered 2013-04-27: 100 ug via INTRAVENOUS

## 2013-04-27 MED ORDER — HYDROMORPHONE HCL PF 1 MG/ML IJ SOLN
INTRAMUSCULAR | Status: AC
  Administered 2013-04-27: 0.25 mg via INTRAVENOUS
  Filled 2013-04-27: qty 1

## 2013-04-27 MED ORDER — ONDANSETRON HCL 4 MG/2ML IJ SOLN: 4.0000 mg | Freq: Four times a day (QID) | INTRAMUSCULAR | Status: DC | PRN

## 2013-04-27 MED ORDER — MIDAZOLAM HCL 2 MG/2ML IJ SOLN
INTRAMUSCULAR | Status: AC
  Filled 2013-04-27: qty 2

## 2013-04-27 MED ORDER — BUPIVACAINE-EPINEPHRINE (PF) 0.5% -1:200000 IJ SOLN
INTRAMUSCULAR | Status: AC
  Filled 2013-04-27: qty 10

## 2013-04-27 MED ORDER — NEOSTIGMINE METHYLSULFATE 1 MG/ML IJ SOLN
INTRAMUSCULAR | Status: DC | PRN
  Administered 2013-04-27: 1 mg via INTRAVENOUS

## 2013-04-27 MED ORDER — SODIUM CHLORIDE 0.9 % IJ SOLN: 3.0000 mL | INTRAMUSCULAR | Status: DC | PRN

## 2013-04-27 MED ORDER — HYDROCODONE-ACETAMINOPHEN 5-325 MG PO TABS
1.0000 | ORAL_TABLET | Freq: Once | ORAL | Status: AC
  Administered 2013-04-27: 2 via ORAL

## 2013-04-27 MED ORDER — SODIUM CHLORIDE 0.9 % IJ SOLN: 3.0000 mL | Freq: Two times a day (BID) | INTRAMUSCULAR | Status: DC

## 2013-04-27 MED ORDER — ROCURONIUM BROMIDE 100 MG/10ML IV SOLN
INTRAVENOUS | Status: DC | PRN
  Administered 2013-04-27: 40 mg via INTRAVENOUS

## 2013-04-27 MED ORDER — MIDAZOLAM HCL 5 MG/5ML IJ SOLN
INTRAMUSCULAR | Status: DC | PRN
  Administered 2013-04-27: 2 mg via INTRAVENOUS

## 2013-04-27 MED ORDER — NEOSTIGMINE METHYLSULFATE 1 MG/ML IJ SOLN
INTRAMUSCULAR | Status: AC
  Filled 2013-04-27: qty 10

## 2013-04-27 MED ORDER — LACTATED RINGERS IV SOLN
INTRAVENOUS | Status: DC | PRN
  Administered 2013-04-27: 12:00:00 via INTRAVENOUS

## 2013-04-27 MED ORDER — SODIUM CHLORIDE 0.9 % IV SOLN: INTRAVENOUS | Status: DC

## 2013-04-27 MED ORDER — FENTANYL CITRATE 0.05 MG/ML IJ SOLN
INTRAMUSCULAR | Status: AC
  Filled 2013-04-27: qty 5

## 2013-04-27 MED ORDER — LIDOCAINE HCL (CARDIAC) 20 MG/ML IV SOLN
INTRAVENOUS | Status: DC | PRN
  Administered 2013-04-27: 60 mg via INTRAVENOUS

## 2013-04-27 SURGICAL SUPPLY — 46 items
BENZOIN TINCTURE PRP APPL 2/3 (GAUZE/BANDAGES/DRESSINGS) ×3 IMPLANT
BLADE SURG 10 STRL SS (BLADE) ×3 IMPLANT
BLADE SURG 15 STRL LF DISP TIS (BLADE) ×1 IMPLANT
BLADE SURG 15 STRL SS (BLADE) ×2
BLADE SURG ROTATE 9660 (MISCELLANEOUS) ×3 IMPLANT
CANISTER SUCTION 2500CC (MISCELLANEOUS) ×3 IMPLANT
CHLORAPREP W/TINT 26ML (MISCELLANEOUS) ×3 IMPLANT
CLOSURE WOUND 1/2 X4 (GAUZE/BANDAGES/DRESSINGS) ×1
COVER SURGICAL LIGHT HANDLE (MISCELLANEOUS) ×3 IMPLANT
DERMABOND ADHESIVE PROPEN (GAUZE/BANDAGES/DRESSINGS) ×2
DERMABOND ADVANCED .7 DNX6 (GAUZE/BANDAGES/DRESSINGS) ×1 IMPLANT
DRAPE LAPAROTOMY T 102X78X121 (DRAPES) ×3 IMPLANT
DRAPE UTILITY 15X26 W/TAPE STR (DRAPE) ×6 IMPLANT
ELECT CAUTERY BLADE 6.4 (BLADE) ×3 IMPLANT
ELECT REM PT RETURN 9FT ADLT (ELECTROSURGICAL) ×3
ELECTRODE REM PT RTRN 9FT ADLT (ELECTROSURGICAL) ×1 IMPLANT
GLOVE BIOGEL PI IND STRL 7.5 (GLOVE) ×1 IMPLANT
GLOVE BIOGEL PI INDICATOR 7.5 (GLOVE) ×2
GLOVE EUDERMIC 7 POWDERFREE (GLOVE) ×3 IMPLANT
GLOVE SURG SS PI 7.0 STRL IVOR (GLOVE) ×3 IMPLANT
GOWN STRL NON-REIN LRG LVL3 (GOWN DISPOSABLE) ×3 IMPLANT
GOWN STRL REIN XL XLG (GOWN DISPOSABLE) ×3 IMPLANT
KIT BASIN OR (CUSTOM PROCEDURE TRAY) ×3 IMPLANT
KIT ROOM TURNOVER OR (KITS) ×3 IMPLANT
MESH VENTRALEX ST 1-7/10 CRC S (Mesh General) ×3 IMPLANT
NEEDLE HYPO 25GX1X1/2 BEV (NEEDLE) ×3 IMPLANT
NS IRRIG 1000ML POUR BTL (IV SOLUTION) ×3 IMPLANT
PACK SURGICAL SETUP 50X90 (CUSTOM PROCEDURE TRAY) ×3 IMPLANT
PAD ARMBOARD 7.5X6 YLW CONV (MISCELLANEOUS) ×6 IMPLANT
PENCIL BUTTON HOLSTER BLD 10FT (ELECTRODE) ×3 IMPLANT
SPONGE GAUZE 4X4 12PLY (GAUZE/BANDAGES/DRESSINGS) ×3 IMPLANT
SPONGE LAP 18X18 X RAY DECT (DISPOSABLE) ×3 IMPLANT
SPONGE LAP 4X18 X RAY DECT (DISPOSABLE) IMPLANT
STRIP CLOSURE SKIN 1/2X4 (GAUZE/BANDAGES/DRESSINGS) ×2 IMPLANT
SUT MNCRL AB 4-0 PS2 18 (SUTURE) ×3 IMPLANT
SUT NOVA NAB DX-16 0-1 5-0 T12 (SUTURE) ×6 IMPLANT
SUT PROLENE 0 CT 1 30 (SUTURE) IMPLANT
SUT SILK 3 0 SH CR/8 (SUTURE) IMPLANT
SUT VIC AB 3-0 SH 18 (SUTURE) ×3 IMPLANT
SYR CONTROL 10ML LL (SYRINGE) ×3 IMPLANT
TOWEL OR 17X24 6PK STRL BLUE (TOWEL DISPOSABLE) IMPLANT
TOWEL OR 17X26 10 PK STRL BLUE (TOWEL DISPOSABLE) ×3 IMPLANT
TUBE CONNECTING 12'X1/4 (SUCTIONS) ×1
TUBE CONNECTING 12X1/4 (SUCTIONS) ×2 IMPLANT
WATER STERILE IRR 1000ML POUR (IV SOLUTION) IMPLANT
YANKAUER SUCT BULB TIP NO VENT (SUCTIONS) ×3 IMPLANT

## 2013-04-27 NOTE — Op Note (Signed)
Patient Name:           Curtis Arellano   Date of Surgery:        04/27/2013  Pre op Diagnosis:      Incarcerated umbilical hernia  Post op Diagnosis:    Incarcerated umbilical hernia  Procedure:                 Repair incarcerated umbilical hernia with mesh  Surgeon:                     Angelia Mould. Derrell Lolling, M.D., FACS  Assistant:                      None  Operative Indications:   Curtis Arellano is a 43 y.o. male. He was initially referred by Dr. Anselmo Rod for evaluation of an umbilical hernia.  The patient has had some GI complaints. One episode of severe epigastric pain. Some cramps. He underwent extensive workup including upper endoscopy, ultrasound, and hepatobiliary scan all of which were normal. He was placed on Nexium in January which may have helped a little bit. Dr. Loreta Ave noticed an umbilical hernia. My exam reveals a small umbilical hernia with incarcerated. Preperitoneal fat. A healthy skin. No other hernias noted. He is brought to the operating room electively.   Operative Findings:       The patient had a defect in the umbilical fascia about 2 cm in size or slightly less. He had a large amount of incarcerated preperitoneal fat that had to be dissected away from the fascial edge and resected. I was able to repair this with a 4.3 cm diameter Ventralex ST composite mesh disc  Procedure in Detail:          Following the induction of general endotracheal anesthesia the patient's abdomen was prepped and draped in a sterile fashion. Intravenous antibiotics were given and a surgical time out was performed. 0.5% Marcaine with epinephrine was used as local infiltration anesthetic. A transverse curvilinear incision was made at the lower rim of the umbilicus. Dissection was carried down through the subcutaneous tissue to the periumbilical fascia. I dissected the umbilical skin off of the incarcerated hernia sac. We entered the sac and slowly debrided all the preperitoneal fat and  discarded that. We then found the defect was probably 2 cm or less in size. We palpated above and below and found no other hernias in the midline. I undermined the subcutaneous tissue circumferentially. I implanted a 4.3 cm ventralex ST mesh dis.. I was careful to implant this with the smooth side down toward the viscera and the rough side up toward the abdominal wall muscles. 4 interrupted mattress sutures of #1 Novafil were placed. These were placed down through the fascia, through the edge of the mesh disc and then back up to through the fascia. I then folded the mesh and  inserted it into the preperitoneal space and lifted up on the sutures and the mesh opened up and deployed smoothly. I tied the Novafil sutures. The fascia was closed with interrupted #1 Novafil. Wound was irrigated with saline. The umbilical skin was tacked down to the fascia with a 3-0 Vicryl suture. The subcutaneous tissue was closed with 3-0 Vicryl sutures and the skin closed with a running subcuticular suture of 4-0 Monocryl and Dermabond. The patient tolerated the procedure well was taken to recovery room in stable condition. EBL 10 cc or less. Counts correct. Complications none.  Angelia MouldHaywood M. Derrell LollingIngram, M.D., FACS General and Minimally Invasive Surgery Breast and Colorectal Surgery  04/27/2013 1:35 PM

## 2013-04-27 NOTE — Preoperative (Signed)
Beta Blockers   Reason not to administer Beta Blockers:Not Applicable 

## 2013-04-27 NOTE — Transfer of Care (Signed)
Immediate Anesthesia Transfer of Care Note  Patient: Curtis Arellano  Procedure(s) Performed: Procedure(s): HERNIA REPAIR UMBILICAL ADULT (N/A) INSERTION OF MESH (N/A)  Patient Location: PACU  Anesthesia Type:General  Level of Consciousness: awake, alert  and oriented  Airway & Oxygen Therapy: Patient Spontanous Breathing and Patient connected to nasal cannula oxygen  Post-op Assessment: Report given to PACU RN and Post -op Vital signs reviewed and stable  Post vital signs: Reviewed and stable  Complications: No apparent anesthesia complications

## 2013-04-27 NOTE — Anesthesia Postprocedure Evaluation (Signed)
  Anesthesia Post-op Note  Patient: Curtis Arellano  Procedure(s) Performed: Procedure(s): HERNIA REPAIR UMBILICAL ADULT (N/A) INSERTION OF MESH (N/A)  Patient Location: PACU  Anesthesia Type:General  Level of Consciousness: awake  Airway and Oxygen Therapy: Patient Spontanous Breathing  Post-op Pain: mild  Post-op Assessment: Post-op Vital signs reviewed  Post-op Vital Signs: Reviewed  Complications: No apparent anesthesia complications

## 2013-04-27 NOTE — Interval H&P Note (Signed)
History and Physical Interval Note:  04/27/2013 11:56 AM  Curtis EastWeldon A Koren  has presented today for surgery, with the diagnosis of umbilical hernia  The goals and the various methods of treatment have been discussed with the patient and family. After consideration of risks, benefits and other options for treatment, the patient has consented to  Procedure(s): HERNIA REPAIR UMBILICAL ADULT (N/A) INSERTION OF MESH (N/A) as a surgical intervention .  The patient's history has been reviewed, patient examined today, no change in status, stable for surgery.  I have reviewed the patient's chart and labs.  Questions were answered to the patient's satisfaction.     Ernestene MentionINGRAM,Nikayla Madaris M

## 2013-04-27 NOTE — Anesthesia Preprocedure Evaluation (Addendum)
Anesthesia Evaluation  Patient identified by MRN, date of birth, ID band Patient awake    Reviewed: Allergy & Precautions, H&P , NPO status   Airway Mallampati: I TM Distance: >3 FB Neck ROM: Full    Dental  (+) Teeth Intact, Dental Advisory Given   Pulmonary          Cardiovascular Rhythm:Regular Rate:Normal     Neuro/Psych    GI/Hepatic GERD-  ,  Endo/Other    Renal/GU      Musculoskeletal   Abdominal   Peds  Hematology   Anesthesia Other Findings   Reproductive/Obstetrics                           Anesthesia Physical Anesthesia Plan  ASA: II  Anesthesia Plan: General   Post-op Pain Management:    Induction:   Airway Management Planned: Oral ETT  Additional Equipment:   Intra-op Plan:   Post-operative Plan: Extubation in OR  Informed Consent:   Plan Discussed with:   Anesthesia Plan Comments:         Anesthesia Quick Evaluation

## 2013-04-29 ENCOUNTER — Encounter (HOSPITAL_COMMUNITY): Payer: Self-pay | Admitting: General Surgery

## 2013-04-30 IMAGING — NM NM HEPATO W/GB/PHARM/[PERSON_NAME]
1 series · 1 of 1 positions shown · non-contrast
Comparison: 01/19/2012

CLINICAL DATA: Chest pain.

NUCLEAR MEDICINE HEPATOBILIARY WITH GB, PHARM AND QUAN MEASURE
Radiopharmaceutical:  5 mCi technetium 99 Choletec; 1.7 micro grams
of CCK

[hepatobiliary · 1 of 1 slices shown]
[im 1/1]
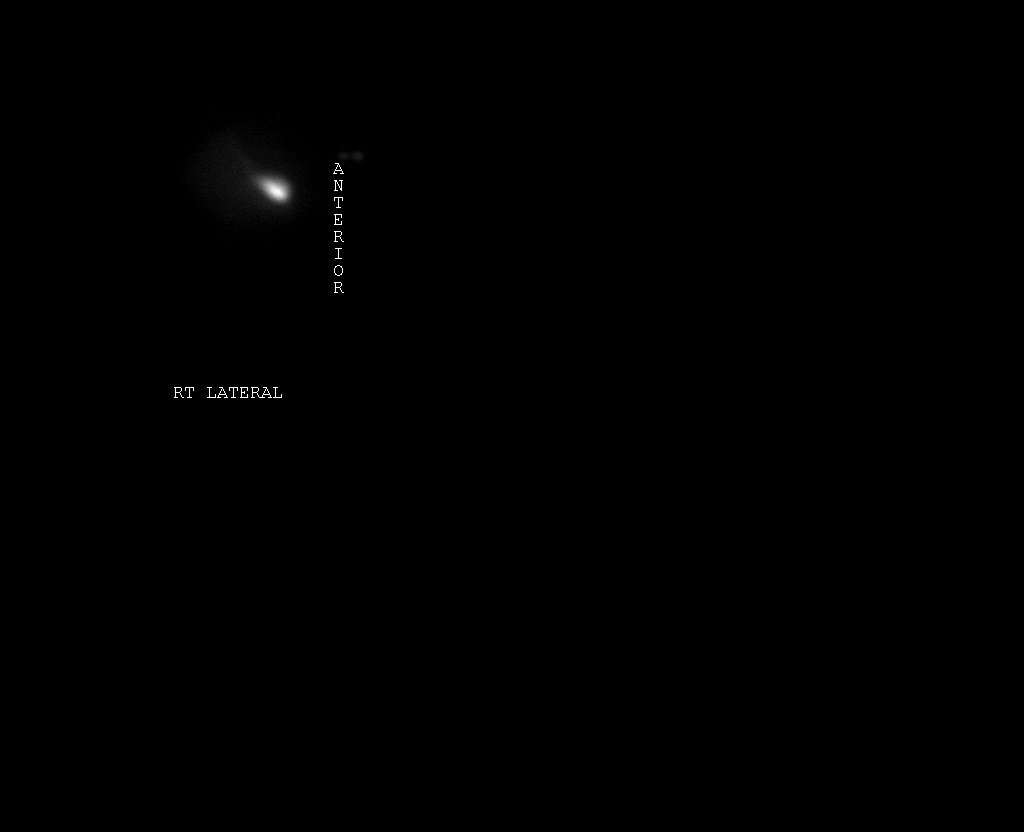

[1 of 1 positions shown; findings below may reference images not displayed]

FINDINGS: There is satisfactory uptake of radiopharmaceutical from
the blood pool, with gallbladder activity visible at 6 minutes.
Initially, the bowel and CBD do not demonstrably fill with
radiopharmaceutical.

Following CCK administration, CBD activity is visible at 4 minutes
and bowel activity is visible by 11 minutes, with some reflux of
bile into the stomach noted.  Gallbladder ejection fraction is 84%
at 26 minutes, within normal limits.  IV administration of CCK did
not cause pain.
IMPRESSION: 1.  Gallbladder ejection is normal.
2.  Initial lack of excretion of bile into the duodenum, which only
occurred after CCK administration.  Query mild spasm in the
sphincter of Oddi.  If there is clinical suspicion of sphincter of
Oddi dysfunction, manometry may be warranted.

## 2013-05-12 ENCOUNTER — Encounter (INDEPENDENT_AMBULATORY_CARE_PROVIDER_SITE_OTHER): Payer: Self-pay | Admitting: General Surgery

## 2013-05-12 ENCOUNTER — Ambulatory Visit (INDEPENDENT_AMBULATORY_CARE_PROVIDER_SITE_OTHER): Payer: 59 | Admitting: General Surgery

## 2013-05-12 VITALS — BP 120/84 | HR 72 | Temp 98.0°F | Resp 15 | Ht 66.0 in | Wt 205.6 lb

## 2013-05-12 DIAGNOSIS — K420 Umbilical hernia with obstruction, without gangrene: Secondary | ICD-10-CM

## 2013-05-12 NOTE — Progress Notes (Signed)
Patient ID: Curtis Arellano, male   DOB: 10-14-1970, 43 y.o.   MRN: [id number] History: This gentleman underwent repair of incarcerated umbilical hernia with mesh disc on 04/27/2013. He is doing well. No wound problems. Minimal discomfort.  Exam:  The patient looks well. Good spirits. No distress Abdomen is soft. Infraumbilical incision healing normally. No hematoma or infection. Hernia repair intact  Assessment: Incarcerated umbilical hernia, recovering uneventfully following repair with mesh  Plan: Resume normal activities without restriction after March 25 Return to see me if needed.    Angelia MouldHaywood M. Derrell LollingIngram, M.D., [facility] General and Minimally invasive Surgery Breast and Colorectal Surgery Office:   [phone] Pager:   [phone]

## 2013-05-12 NOTE — Patient Instructions (Signed)
You are recovering from your umbilical hernia repair without any surgical complications.  You may resume normal physical activities without restriction after March 25  Return to see Dr. Derrell LollingIngram as needed.

## 2014-04-02 ENCOUNTER — Ambulatory Visit (INDEPENDENT_AMBULATORY_CARE_PROVIDER_SITE_OTHER): Payer: BLUE CROSS/BLUE SHIELD | Admitting: Physician Assistant

## 2014-04-02 VITALS — BP 128/86 | HR 76 | Temp 98.5°F | Resp 16 | Ht 66.0 in | Wt 198.0 lb

## 2014-04-02 DIAGNOSIS — B9789 Other viral agents as the cause of diseases classified elsewhere: Principal | ICD-10-CM

## 2014-04-02 DIAGNOSIS — J029 Acute pharyngitis, unspecified: Secondary | ICD-10-CM

## 2014-04-02 DIAGNOSIS — J069 Acute upper respiratory infection, unspecified: Secondary | ICD-10-CM

## 2014-04-02 LAB — POCT RAPID STREP A (OFFICE): Rapid Strep A Screen: NEGATIVE

## 2014-04-02 MED ORDER — CETIRIZINE-PSEUDOEPHEDRINE ER 5-120 MG PO TB12: ORAL_TABLET | ORAL | Status: AC

## 2014-04-02 MED ORDER — HYDROCODONE-HOMATROPINE 5-1.5 MG/5ML PO SYRP: 5.0000 mL | ORAL_SOLUTION | Freq: Three times a day (TID) | ORAL | Status: DC | PRN

## 2014-04-02 NOTE — Patient Instructions (Signed)
Take 600 mg of ibuprofen every 8 hours for sore throat.  For added pain relief, you may add tylenol 500 to 1000 mg every 8 hours.

## 2014-04-02 NOTE — Progress Notes (Signed)
04/02/2014 at 8:44 AM  Curtis Arellano / DOB: 08-29-1970 / MRN: [id number]  The patient has Refusal of blood transfusions as patient is Jehovah's Witness; GERD (gastroesophageal reflux disease); Anxiety; Chest pain; Umbilical hernia with obstruction; and Obesity, unspecified on his problem list.  SUBJECTIVE  Chief compalaint: Sore Throat and Cough   History of present illness: Curtis Arellano is 44 y.o. well appearing male presenting for the evaluation of a cold like illness that started approximately 7 days ago. He feels his symptoms had gotten better as of three days ago, but 2 days ago he developed a cough and his sore throat has persisted, mostly in the morning.  Associated symptoms include sinus congestion, myalgias, and fatigue. He has not had the seasonal flu shot.  He denies sick contacts.  He has tried 200 mg ibuprofen and Mucinex cold and cough with good to fair relief.    He denies a history of kidney disease and PUD.    He  has a past medical history of Refusal of blood transfusions as patient is Jehovah's Witness (02/21/11); GERD (gastroesophageal reflux disease); Anxiety; Incarcerated umbilical hernia (04/2012); Normal cardiac stress test (2013); and Left knee pain.    He  has a current medication list which includes the following prescription(s): ibuprofen.  Curtis Arellano is allergic to penicillins. He  reports that he has never smoked. He has never used smokeless tobacco. He reports that he drinks about 2.4 oz of alcohol per week. He reports that he does not use illicit drugs. He  reports that he currently engages in sexual activity. He reports using the following method of birth control/protection: Surgical.  The patient  has past surgical history that includes Anterior cruciate ligament repair (~ 1998); Fracture surgery (~ 1992); Nasal septum surgery (~ 1990); Eye surgery (~ 2004); Vasectomy; Umbilical hernia repair (N/A, 04/27/2013); Insertion of mesh (N/A, 04/27/2013); and  Hernia repair.  His family history includes Anxiety disorder in his mother; Gallbladder disease in his mother; Hyperlipidemia in his father; Hypertension in his father; Stroke in his maternal grandmother; Ulcers in his maternal grandfather.  Review of Systems  Constitutional: Negative for fever, chills and weight loss.  HENT: Positive for congestion and sore throat.   Respiratory: Positive for cough. Negative for hemoptysis and shortness of breath.   Cardiovascular: Negative for chest pain and palpitations.  Gastrointestinal: Negative for heartburn, nausea, vomiting and abdominal pain.  Genitourinary: Negative.   Musculoskeletal: Positive for myalgias.  Skin: Negative for itching and rash.  Neurological: Negative for dizziness and headaches.    OBJECTIVE   height is  (1.676 m) and weight is 198 lb (89.812 kg). His temperature is 98.5 F (36.9 C). His blood pressure is 128/86 and his pulse is 76. His respiration is 16 and oxygen saturation is 98%.  The patient's body mass index is 31.97 kg/(m^2).  Physical Exam  Constitutional: He is oriented to person, place, and time.  HENT:  Right Ear: Hearing, tympanic membrane, external ear and ear canal normal.  Left Ear: Hearing, tympanic membrane, external ear and ear canal normal.  Nose: Mucosal edema present. Right sinus exhibits no maxillary sinus tenderness and no frontal sinus tenderness. Left sinus exhibits no maxillary sinus tenderness and no frontal sinus tenderness.  Mouth/Throat: Uvula is midline, oropharynx is clear and moist and mucous membranes are normal. No uvula swelling.    Cardiovascular: Normal rate, regular rhythm and normal heart sounds.   Respiratory: Effort normal and breath sounds normal.  GI: Bowel sounds  are normal.  Musculoskeletal: Normal range of motion.  Lymphadenopathy:       Head (right side): No submental, no submandibular, no tonsillar, no preauricular, no posterior auricular and no occipital adenopathy  present.       Head (left side): No submental, no submandibular, no tonsillar, no preauricular, no posterior auricular and no occipital adenopathy present.    He has no cervical adenopathy.  Neurological: He is alert and oriented to person, place, and time.  Skin: Skin is warm and dry.  Psychiatric: He has a normal mood and affect.    Results for orders placed or performed in visit on 04/02/14 (from the past 24 hour(s))  POCT rapid strep A     Status: None   Collection Time: 04/02/14  8:33 AM  Result Value Ref Range   Rapid Strep A Screen Negative Negative    ASSESSMENT & PLAN  Curtis Arellano was seen today for sore throat and cough.  Diagnoses and associated orders for this visit:  Viral URI with cough: HPI and PE reassuring.  Rapid strep negative.  I doubt this illness has a bacterial component.  Will culture strep to be certain and will change treatment plan accordingly.  Advised patient that if he is not better after 14 days of total illness to call or come back to clinic.   - cetirizine-pseudoephedrine (ZYRTEC-D ALLERGY & CONGESTION) 5-120 MG per tablet; Take one tab in the morning. - HYDROcodone-homatropine (HYCODAN) 5-1.5 MG/5ML syrup; Take 5 mLs by mouth every 8 (eight) hours as needed for cough.  Sore throat - POCT rapid strep A - Culture, Group A Strep     The patient was instructed to to call or comeback to clinic as needed, or should symptoms warrant.  I personally spent more than 25 minutes with the patient, with the majority of the time spent in counseling and discussion of the assessment and plan.    Deliah BostonMichael Louis Ivery, MHS, PA-C [facility] 04/02/2014 8:44 AM

## 2014-04-04 LAB — CULTURE, GROUP A STREP: Organism ID, Bacteria: NORMAL

## 2014-04-25 ENCOUNTER — Ambulatory Visit (INDEPENDENT_AMBULATORY_CARE_PROVIDER_SITE_OTHER): Payer: BLUE CROSS/BLUE SHIELD | Admitting: Emergency Medicine

## 2014-04-25 VITALS — BP 138/82 | HR 78 | Temp 98.0°F | Resp 16 | Ht 66.0 in | Wt 194.0 lb

## 2014-04-25 DIAGNOSIS — S86111A Strain of other muscle(s) and tendon(s) of posterior muscle group at lower leg level, right leg, initial encounter: Secondary | ICD-10-CM

## 2014-04-25 MED ORDER — NAPROXEN SODIUM 550 MG PO TABS: 550.0000 mg | ORAL_TABLET | Freq: Two times a day (BID) | ORAL | Status: AC

## 2014-04-25 NOTE — Patient Instructions (Signed)

## 2014-04-25 NOTE — Progress Notes (Signed)
[facility] [address] [place] [address] [phone]  Date:  04/25/2014   Name:  Curtis Arellano   DOB:  [birth year]   MRN:  [id number]  PCP:  Dow Adolph, MD    Chief Complaint: Leg Pain   History of Present Illness:  Curtis Arellano Curtis Arellano is a 44 y.o. very pleasant male patient who presents with the following:  Injured his right calf running repeatedly over past month or so.  Planning on 1/2 marathon in two weeks Not able to run more than a mile or so now but tolerates an Furniture conservator/restorer. No ecchymosis or swelling. Has been icing and elevating. No improvement with over the counter medications or other home remedies. Denies other complaint or health concern today.   Patient Active Problem List   Diagnosis Date Noted  . Obesity, unspecified 03/31/2013  . Umbilical hernia with obstruction 06/14/2012  . Chest pain 01/12/2012  . GERD (gastroesophageal reflux disease)   . Anxiety   . Refusal of blood transfusions as patient is Jehovah's Witness 02/21/2011    Past Medical History  Diagnosis Date  . Refusal of blood transfusions as patient is Jehovah's Witness 02/21/11  . GERD (gastroesophageal reflux disease)   . Anxiety   . Incarcerated umbilical hernia 04/2012  . Normal cardiac stress test 2013  . Left knee pain     Past Surgical History  Procedure Laterality Date  . Anterior cruciate ligament repair  ~ 1998    left knee  . Fracture surgery  ~ 1992    broken nose  . Nasal septum surgery  ~ 1990  . Eye surgery  ~ 2004    "growth on right eye cut off"  . Vasectomy    . Umbilical hernia repair N/A 04/27/2013    Procedure: HERNIA REPAIR UMBILICAL ADULT;  Surgeon: Ernestene Mention, MD;  Location: [facility] OR;  Service: General;  Laterality: N/A;  . Insertion of mesh N/A 04/27/2013    Procedure: INSERTION OF MESH;  Surgeon: Ernestene Mention, MD;  Location: [facility] OR;  Service: General;  Laterality: N/A;  . Hernia repair      History   Substance Use Topics  . Smoking status: Never Smoker   . Smokeless tobacco: Never Used  . Alcohol Use: 2.4 oz/week    4 Cans of beer per week    Family History  Problem Relation Age of Onset  . Hypertension Father   . Hyperlipidemia Father   . Stroke Maternal Grandmother   . Gallbladder disease Mother     removed  . Anxiety disorder Mother   . Ulcers Maternal Grandfather     per pt stomach ulcer with bleeding    Allergies  Allergen Reactions  . Penicillins Rash    Medication list has been reviewed and updated.  Current Outpatient Prescriptions on File Prior to Visit  Medication Sig Dispense Refill  . HYDROcodone-homatropine (HYCODAN) 5-1.5 MG/5ML syrup Take 5 mLs by mouth every 8 (eight) hours as needed for cough. (Patient not taking: Reported on 04/25/2014) 120 mL 0  . ibuprofen (ADVIL,MOTRIN) 200 MG tablet Take 800 mg by mouth every 6 (six) hours as needed for headache.     No current facility-administered medications on file prior to visit.    Review of Systems:  As per HPI, otherwise negative.    Physical Examination: Filed Vitals:   04/25/14 0814  BP: 138/82  Pulse: 78  Temp: 98 F (36.7 C)  Resp: 16   Filed  Vitals:   04/25/14 0814  Height: 5\' 6"  (1.676 m)  Weight: 194 lb (87.998 kg)   Body mass index is 31.33 kg/(m^2). Ideal Body Weight: Weight in (lb) to have BMI = 25: 154.6   GEN: WDWN, NAD, Non-toxic, Alert & Oriented x 3 HEENT: Atraumatic, Normocephalic.  Ears and Nose: No external deformity. EXTR: No clubbing/cyanosis/edema NEURO: Normal gait.  PSYCH: Normally interactive. Conversant. Not depressed or anxious appearing.  Calm demeanor.  Little calf tenderness at origin of achilles.  Full A&PROM No ecchymosis or deformity  Assessment and Plan: Soleus strain Anaprox RICE Compression sleeve Sports med  Signed,  Phillips OdorJeffery Lexee Brashears, MD

## 2019-10-10 DIAGNOSIS — K5900 Constipation, unspecified: Secondary | ICD-10-CM | POA: Insufficient documentation

## 2019-10-10 DIAGNOSIS — R141 Gas pain: Secondary | ICD-10-CM | POA: Insufficient documentation

## 2021-05-20 ENCOUNTER — Telehealth: Payer: Self-pay

## 2021-05-20 NOTE — Progress Notes (Signed)
CARDIOLOGY CONSULT NOTE  ? ? ? ? ? ?Patient ID: ?[name] ?MRN: [id number] ?DOB/AGE: 04-Oct-1970 51 y.o. ? ? ?Referring Physician: Marice Potter MD [facility]  ?Primary Physician: Pcp, No ?Primary Cardiologist: New  ?Reason for Consultation: HTN  ? ?Active Problems: ?  * No active hospital problems. * ? ? ?HPI:  51 y.o. referred by Curtis Arellano for HTN.  No primary care doctor History of GERD and anxiety Curtis Arellano's Witness no blood products Umbilical hernia repair with Dr Curtis Arellano in 2015 Non smoker 4 beers/week He is overweight and BP has been running high I saw him in 2013 for atypical chest pain ? GERD improved with nexium Normal ETT 02/2012 Echo 09/10/12 EF 55-60% mild LVH mild LAE Noted fast med visit for ? Shingles /pruritis 01/2021 and BP 172/100 declined Rx  ? ?He just had a steroid shot in his back. He drinks at least 2 gins a night. He does work out some Been working at an Public relations account executive firm for 30 years since age 89.  Has not had any recent medical care Home BP readings elevated 170/90 range  ? ?ROS ?All other systems reviewed and negative except as noted above ? ?Past Medical History:  ?Diagnosis Date  ? Anxiety   ? GERD (gastroesophageal reflux disease)   ? Incarcerated umbilical hernia 04/2012  ? Left knee pain   ? Normal cardiac stress test 2013  ? Refusal of blood transfusions as patient is Curtis Arellano's Witness 02/21/11  ?  ?Family History  ?Problem Relation Age of Onset  ? Hypertension Father   ? Hyperlipidemia Father   ? Stroke Maternal Grandmother   ? Gallbladder disease Mother   ?     removed  ? Anxiety disorder Mother   ? Ulcers Maternal Grandfather   ?     per pt stomach ulcer with bleeding  ?  ?Social History  ? ?Socioeconomic History  ? Marital status: Married  ?  Spouse name: Not on file  ? Number of children: Not on file  ? Years of education: Not on file  ? Highest education level: Not on file  ?Occupational History  ? Occupation: IT specialist  ?  Employer: [employer]    ?Tobacco Use  ? Smoking status: Never  ? Smokeless tobacco: Never  ?Substance and Sexual Activity  ? Alcohol use: Yes  ?  Alcohol/week: 4.0 standard drinks  ?  Types: 4 Cans of beer per week  ? Drug use: No  ? Sexual activity: Yes  ?  Birth control/protection: Surgical  ?  Comment: married  ?Other Topics Concern  ? Not on file  ?Social History Narrative  ? Per blue health survey form - pt does not exercise  ? ?Social Determinants of Health  ? ?Financial Resource Strain: Not on file  ?Food Insecurity: Not on file  ?Transportation Needs: Not on file  ?Physical Activity: Not on file  ?Stress: Not on file  ?Social Connections: Not on file  ?Intimate Partner Violence: Not on file  ?  ?Past Surgical History:  ?Procedure Laterality Date  ? ANTERIOR CRUCIATE LIGAMENT REPAIR  ~ 1998  ? left knee  ? EYE SURGERY  ~ 2004  ? "growth on right eye cut off"  ? FRACTURE SURGERY  ~ 1992  ? broken nose  ? HERNIA REPAIR    ? INSERTION OF MESH N/A 04/27/2013  ? Procedure: INSERTION OF MESH;  Surgeon: Curtis Mention, MD;  Location: [facility] OR;  Service: General;  Laterality: N/A;  ?  NASAL SEPTUM SURGERY  ~ 1990  ? UMBILICAL HERNIA REPAIR N/A 04/27/2013  ? Procedure: HERNIA REPAIR UMBILICAL ADULT;  Surgeon: Curtis Mention, MD;  Location: [facility] OR;  Service: General;  Laterality: N/A;  ? VASECTOMY    ?  ? ? ?Current Outpatient Medications:  ?  HYDROcodone-homatropine (HYCODAN) 5-1.5 MG/5ML syrup, Take 5 mLs by mouth every 8 (eight) hours as needed for cough. (Patient not taking: Reported on 05/22/2021), Disp: 120 mL, Rfl: 0 ?  ibuprofen (ADVIL,MOTRIN) 200 MG tablet, Take 800 mg by mouth every 6 (six) hours as needed for headache. (Patient not taking: Reported on 05/22/2021), Disp: , Rfl:  ? ? ? ?Physical Exam: ?Blood pressure (!) 160/98, pulse 68, height 5\' 6"  (1.676 m), weight 200 lb (90.7 kg), SpO2 98 %.  ?  ?Affect appropriate ?Healthy:  appears stated age ?HEENT: normal ?Neck supple with no adenopathy ?JVP normal no bruits no  thyromegaly ?Lungs clear with no wheezing and good diaphragmatic motion ?Heart:  S1/S2 no murmur, no rub, gallop or click ?PMI normal ?Abdomen: benighn, BS positve, no tenderness, no AAA ?no bruit.  No HSM or HJR ?Distal pulses intact with no bruits ?No edema ?Neuro non-focal ?Skin warm and dry ?No muscular weakness ? ? ?Labs: ?  ?Lab Results  ?Component Value Date  ? WBC 5.1 04/13/2013  ? HGB 15.6 04/13/2013  ? HCT 42.9 04/13/2013  ? MCV 87.6 04/13/2013  ? PLT 219 04/13/2013  ? No results for input(s): NA, K, CL, CO2, BUN, CREATININE, CALCIUM, PROT, BILITOT, ALKPHOS, ALT, AST, GLUCOSE in the last 168 hours. ? ?Invalid input(s): LABALBU ?Lab Results  ?Component Value Date  ? CKTOTAL 101 02/21/2011  ? CKMB 1.7 02/21/2011  ? TROPONINI <0.30 02/21/2011  ?  ?Lab Results  ?Component Value Date  ? CHOL 262 (H) 03/31/2013  ? CHOL 196 01/08/2012  ? ?Lab Results  ?Component Value Date  ? HDL 48 03/31/2013  ? HDL 34 (L) 01/08/2012  ? ?Lab Results  ?Component Value Date  ? LDLCALC 182 (H) 03/31/2013  ? LDLCALC 141 (H) 01/08/2012  ? ?Lab Results  ?Component Value Date  ? TRIG 158 (H) 03/31/2013  ? TRIG 106 01/08/2012  ? ?Lab Results  ?Component Value Date  ? CHOLHDL 5.5 03/31/2013  ? CHOLHDL 5.8 01/08/2012  ? ?Lab Results  ?Component Value Date  ? LDLDIRECT 170 (H) 02/21/2011  ?  ?  ?Radiology: ?No results found. ? ?EKG: SR rate 68 LVH with strain  ? ? ?ASSESSMENT AND PLAN:  ? ?HTN:  start Cozaar 100 mg Discussed cutting back on ETOH.  Discussed DASH type diet Will get him a BP cuff for home monitoring F/U Pharm D 3 weeks for titration Check labs today including primary labs since no medical F/U Will need f/u BMET in 3 weeks on ARB.  Would add diuretic or Norvasc next visit if BP still elevated  He does have a new primary care visit at [facility] in April His ECG is abnormal likely representing LVH with strain and we will get an echo to assess this  ? ? ?F/U 3 weeks Pharm D /HTN ?Labs above ?TTE ? ?F/U with me next available   ? ?Signed: ?09-02-2003 ?05/22/2021, 9:43 AM ? ? ?

## 2021-05-20 NOTE — Telephone Encounter (Signed)
Left message for patient to call back  

## 2021-05-20 NOTE — Telephone Encounter (Signed)
Talked to patient, scheduled him for Wednesday at 9:30 am with Dr. Eden Emms. ?

## 2021-05-22 ENCOUNTER — Encounter: Payer: Self-pay | Admitting: Cardiovascular Disease

## 2021-05-22 ENCOUNTER — Other Ambulatory Visit: Payer: Self-pay

## 2021-05-22 ENCOUNTER — Ambulatory Visit (INDEPENDENT_AMBULATORY_CARE_PROVIDER_SITE_OTHER): Payer: 59 | Admitting: Cardiovascular Disease

## 2021-05-22 VITALS — BP 160/98 | HR 68 | Ht 66.0 in | Wt 200.0 lb

## 2021-05-22 DIAGNOSIS — K219 Gastro-esophageal reflux disease without esophagitis: Secondary | ICD-10-CM | POA: Diagnosis not present

## 2021-05-22 DIAGNOSIS — R002 Palpitations: Secondary | ICD-10-CM

## 2021-05-22 DIAGNOSIS — I10 Essential (primary) hypertension: Secondary | ICD-10-CM | POA: Diagnosis not present

## 2021-05-22 DIAGNOSIS — R9431 Abnormal electrocardiogram [ECG] [EKG]: Secondary | ICD-10-CM | POA: Diagnosis not present

## 2021-05-22 LAB — COMPREHENSIVE METABOLIC PANEL
ALT: 22 IU/L (ref 0–44)
AST: 12 IU/L (ref 0–40)
Albumin/Globulin Ratio: 2.1 (ref 1.2–2.2)
Albumin: 4.8 g/dL (ref 3.8–4.9)
Alkaline Phosphatase: 55 IU/L (ref 44–121)
BUN/Creatinine Ratio: 25 — ABNORMAL HIGH (ref 9–20)
BUN: 22 mg/dL (ref 6–24)
Bilirubin Total: 0.4 mg/dL (ref 0.0–1.2)
CO2: 26 mmol/L (ref 20–29)
Calcium: 9.8 mg/dL (ref 8.7–10.2)
Chloride: 104 mmol/L (ref 96–106)
Creatinine, Ser: 0.87 mg/dL (ref 0.76–1.27)
Globulin, Total: 2.3 g/dL (ref 1.5–4.5)
Glucose: 90 mg/dL (ref 70–99)
Potassium: 4.6 mmol/L (ref 3.5–5.2)
Sodium: 143 mmol/L (ref 134–144)
Total Protein: 7.1 g/dL (ref 6.0–8.5)
eGFR: 104 mL/min/{1.73_m2} (ref >59)

## 2021-05-22 LAB — LIPID PANEL
Chol/HDL Ratio: 4.5 ratio (ref 0.0–5.0)
Cholesterol, Total: 309 mg/dL — ABNORMAL HIGH (ref 100–199)
HDL: 68 mg/dL (ref >39)
LDL Chol Calc (NIH): 222 mg/dL — ABNORMAL HIGH (ref 0–99)
Triglycerides: 112 mg/dL (ref 0–149)
VLDL Cholesterol Cal: 19 mg/dL (ref 5–40)

## 2021-05-22 LAB — CBC WITH DIFFERENTIAL/PLATELET
Basophils Absolute: 0 10*3/uL (ref 0.0–0.2)
Basos: 0 %
EOS (ABSOLUTE): 0.1 10*3/uL (ref 0.0–0.4)
Eos: 1 %
Hematocrit: 43.1 % (ref 37.5–51.0)
Hemoglobin: 15 g/dL (ref 13.0–17.7)
Immature Grans (Abs): 0 10*3/uL (ref 0.0–0.1)
Immature Granulocytes: 0 %
Lymphocytes Absolute: 2.3 10*3/uL (ref 0.7–3.1)
Lymphs: 30 %
MCH: 31.9 pg (ref 26.6–33.0)
MCHC: 34.8 g/dL (ref 31.5–35.7)
MCV: 92 fL (ref 79–97)
Monocytes Absolute: 0.7 10*3/uL (ref 0.1–0.9)
Monocytes: 9 %
Neutrophils Absolute: 4.6 10*3/uL (ref 1.4–7.0)
Neutrophils: 60 %
Platelets: 258 10*3/uL (ref 150–450)
RBC: 4.7 x10E6/uL (ref 4.14–5.80)
RDW: 13.2 % (ref 11.6–15.4)
WBC: 7.7 10*3/uL (ref 3.4–10.8)

## 2021-05-22 LAB — HEMOGLOBIN A1C
Est. average glucose Bld gHb Est-mCnc: 105 mg/dL
Hgb A1c MFr Bld: 5.3 % (ref 4.8–5.6)

## 2021-05-22 LAB — PSA: Prostate Specific Ag, Serum: 0.4 ng/mL (ref 0.0–4.0)

## 2021-05-22 LAB — TSH: TSH: 2.09 u[IU]/mL (ref 0.450–4.500)

## 2021-05-22 MED ORDER — LOSARTAN POTASSIUM 100 MG PO TABS: 100.0000 mg | ORAL_TABLET | Freq: Every day | ORAL | 3 refills | Status: DC

## 2021-05-22 NOTE — Patient Instructions (Addendum)
Medication Instructions:  ?Your physician has recommended you make the following change in your medication:  ?1-START Losartan 100 mg by mouth daily. ? ?Your physician recommends that you continue on your current medications as directed. Please refer to the Current Medication list given to you today. ? ?*If you need a refill on your cardiac medications before your next appointment, please call your pharmacy* ? ?Lab Work: ?Your physician recommends that you have lab work today- CMET, CBC, PSA, Lipid, TSH, HgbA1c ?If you have labs (blood work) drawn today and your tests are completely normal, you will receive your results only by: ?MyChart Message (if you have MyChart) OR ?A paper copy in the mail ?If you have any lab test that is abnormal or we need to change your treatment, we will call you to review the results. ? ?Testing/Procedures: ?Your physician has requested that you have an echocardiogram. Echocardiography is a painless test that uses sound waves to create images of your heart. It provides your doctor with information about the size and shape of your heart and how well your heart?s chambers and valves are working. This procedure takes approximately one hour. There are no restrictions for this procedure. ? ?Follow-Up: ?At [facility], you and your health needs are our priority.  As part of our continuing mission to provide you with exceptional heart care, we have created designated Provider Care Teams.  These Care Teams include your primary Cardiologist (physician) and Advanced Practice Providers (APPs -  Physician Assistants and Nurse Practitioners) who all work together to provide you with the care you need, when you need it. ? ?We recommend signing up for the patient portal called "MyChart".  Sign up information is provided on this After Visit Summary.  MyChart is used to connect with patients for Virtual Visits (Telemedicine).  Patients are able to view lab/test results, encounter notes, upcoming  appointments, etc.  Non-urgent messages can be sent to your provider as well.   ?To learn more about what you can do with MyChart, go to [email or web address].   ? ?Your next appointment:   ?Next available ? ?The format for your next appointment:   ?In Person ? ?Provider:   ?Charlton Haws, MD { ? ? ?You have been referred to Blood Pressure Clinic with Pharmacy in 3 weeks. ?

## 2021-05-24 ENCOUNTER — Telehealth: Payer: Self-pay

## 2021-05-24 DIAGNOSIS — I10 Essential (primary) hypertension: Secondary | ICD-10-CM

## 2021-05-24 DIAGNOSIS — E7800 Pure hypercholesterolemia, unspecified: Secondary | ICD-10-CM

## 2021-05-24 MED ORDER — ROSUVASTATIN CALCIUM 10 MG PO TABS: 10.0000 mg | ORAL_TABLET | Freq: Every day | ORAL | 3 refills | Status: DC

## 2021-05-24 NOTE — Telephone Encounter (Signed)
Patient aware of results. Will send Crestor to patient's pharmacy of choice. Patient will come in on 08/28/21 for repeat lab work. Will put in referral for lipid clinic. ?

## 2021-05-24 NOTE — Telephone Encounter (Signed)
-----   Message from Wendall Stade, MD sent at 05/23/2021 11:33 AM EDT ----- ?Chol very high start crestor 10 mg daily repeat labs in 3 months other labs fine Can aske him if he wants to do calcium score to further assess risk of CADF/U lipid clinic 3 months  ?

## 2021-05-31 ENCOUNTER — Ambulatory Visit (HOSPITAL_COMMUNITY): Payer: 59 | Attending: Cardiology

## 2021-05-31 DIAGNOSIS — I10 Essential (primary) hypertension: Secondary | ICD-10-CM | POA: Insufficient documentation

## 2021-05-31 DIAGNOSIS — R9431 Abnormal electrocardiogram [ECG] [EKG]: Secondary | ICD-10-CM | POA: Diagnosis not present

## 2021-05-31 DIAGNOSIS — K219 Gastro-esophageal reflux disease without esophagitis: Secondary | ICD-10-CM | POA: Diagnosis present

## 2021-05-31 LAB — ECHOCARDIOGRAM COMPLETE
Area-P 1/2: 5.31 cm2
S' Lateral: 2.2 cm

## 2021-06-14 ENCOUNTER — Ambulatory Visit (INDEPENDENT_AMBULATORY_CARE_PROVIDER_SITE_OTHER): Payer: 59 | Admitting: Pharmacist

## 2021-06-14 VITALS — BP 130/90 | HR 79

## 2021-06-14 DIAGNOSIS — I10 Essential (primary) hypertension: Secondary | ICD-10-CM

## 2021-06-14 LAB — BASIC METABOLIC PANEL
BUN/Creatinine Ratio: 21 — ABNORMAL HIGH (ref 9–20)
BUN: 19 mg/dL (ref 6–24)
CO2: 25 mmol/L (ref 20–29)
Calcium: 9.5 mg/dL (ref 8.7–10.2)
Chloride: 103 mmol/L (ref 96–106)
Creatinine, Ser: 0.89 mg/dL (ref 0.76–1.27)
Glucose: 93 mg/dL (ref 70–99)
Potassium: 4.3 mmol/L (ref 3.5–5.2)
Sodium: 141 mmol/L (ref 134–144)
eGFR: 104 mL/min/{1.73_m2} (ref >59)

## 2021-06-14 NOTE — Progress Notes (Signed)
Patient ID: NICKSON MIDDLESWORTH                 DOB: 1970/04/14                      MRN: [id number] ? ? ? ? ?HPI: ?Curtis Arellano is a 51 y.o. male referred by Dr. Eden Emms to HTN clinic. PMH is significant for HTN, GERD, HLD and anxiety. Patient last saw Dr. Eden Emms on 05/22/21. His blood pressure was 160/98. Losartan 100mg  daily was started. He was encouraged to decrease alcohol intake.  ? ?Patient presents today to HTN clinic. He endorses tolerating losartan well. He denies dizziness, lightheadedness, headache, blurred vision, SOB or swelling. He takes IBU only ocassionally if he needs prior to golf for his back. Use to play a lot of golf and pickleball but his back has been giving him issues. Sees a doctor soon about this. Home BP cuff is an upper arm [facility] . Takes BP twice a day in the AM and then late evening. Takes losartan around 9AM. Walks twice a day for 15 min at work, but pace is not very fast. ? ?Current HTN meds: losartan 100mg  daily ?Previously tried:  ?BP goal: <130/80 ? ?Family History:  ?Family History  ?Problem Relation Age of Onset  ? Hypertension Father   ? Hyperlipidemia Father   ? Stroke Maternal Grandmother   ? Gallbladder disease Mother   ?     removed  ? Anxiety disorder Mother   ? Ulcers Maternal Grandfather   ?     per pt stomach ulcer with bleeding  ? ? ? ?Social History: 2 shots of gin per night, no tobacco, no illict drugs ? ?Diet: not addressed today ? ?Exercise: golf, gym 3 times x week for strength training ? ?Home BP readings: 139/78, 144/88, 140/88, 156/108, 149/92, 158/91, 139/75, 151/92, 119/79, 142/84, 124/75, 146/88, 137/85, 153/92 ? ?Wt Readings from Last 3 Encounters:  ?05/22/21 200 lb (90.7 kg)  ?04/25/14 194 lb (88 kg)  ?04/02/14 198 lb (89.8 kg)  ? ?BP Readings from Last 3 Encounters:  ?05/22/21 (!) 160/98  ?04/25/14 138/82  ?04/02/14 128/86  ? ?Pulse Readings from Last 3 Encounters:  ?05/22/21 68  ?04/25/14 78  ?04/02/14 76  ? ? ?Renal function: ?CrCl cannot  be calculated (Patient's most recent lab result is older than the maximum 21 days allowed.). ? ?Past Medical History:  ?Diagnosis Date  ? Anxiety   ? GERD (gastroesophageal reflux disease)   ? Incarcerated umbilical hernia 04/2012  ? Left knee pain   ? Normal cardiac stress test 2013  ? Refusal of blood transfusions as patient is Jehovah's Witness 02/21/11  ? ? ?Current Outpatient Medications on File Prior to Visit  ?Medication Sig Dispense Refill  ? HYDROcodone-homatropine (HYCODAN) 5-1.5 MG/5ML syrup Take 5 mLs by mouth every 8 (eight) hours as needed for cough. (Patient not taking: Reported on 05/22/2021) 120 mL 0  ? ibuprofen (ADVIL,MOTRIN) 200 MG tablet Take 800 mg by mouth every 6 (six) hours as needed for headache. (Patient not taking: Reported on 05/22/2021)    ? losartan (COZAAR) 100 MG tablet Take 1 tablet (100 mg total) by mouth daily. 90 tablet 3  ? rosuvastatin (CRESTOR) 10 MG tablet Take 1 tablet (10 mg total) by mouth daily. 90 tablet 3  ? ?No current facility-administered medications on file prior to visit.  ? ? ?Allergies  ?Allergen Reactions  ? Penicillins Rash  ? ? ?There were no vitals  taken for this visit. ? ? ?Assessment/Plan: ? ?1. Hypertension - Blood pressure above goal of <130/80 in clinic today. Home readings vary, some at goal but others in the 140's and 150's. Patient does do strength training, but not much cardio. I gave him the option of adding another medication vs working on increasing cardio for 1 month before making medication changes. Patient opted to increase his cardio. I have asked him to do moderate cardio daily or higher intensity cardio 5 days a week. We also talked about how his alcohol intake could be affecting BP. Although he is not over the 2 drinks per day recommendation for men, his consistent 2 alcoholic drinks daily could be raising his BP and I encouraged him to decrease. Follow up in clinic on 5/9. BMP today since starting losartan.  ? ? ?Thank you ? ?Olene Floss, Pharm.D, BCPS, CPP ?[facility]  ?[address], [place], [place] [address]  ?Phone: [phone]; Fax: [phone]  ? ? ?

## 2021-06-14 NOTE — Patient Instructions (Addendum)
Increase cardio exercise to 5 days per week. Increase walking to a more brisk walk ?Continue checking blood pressure ?Please bring your blood pressure cuff with you to your next appointment. ? ?

## 2021-06-18 ENCOUNTER — Encounter (HOSPITAL_BASED_OUTPATIENT_CLINIC_OR_DEPARTMENT_OTHER): Payer: Self-pay | Admitting: Family Medicine

## 2021-06-18 ENCOUNTER — Ambulatory Visit (INDEPENDENT_AMBULATORY_CARE_PROVIDER_SITE_OTHER): Payer: 59 | Admitting: Family Medicine

## 2021-06-18 VITALS — BP 143/107 | HR 84 | Temp 97.7°F | Ht 66.0 in | Wt 200.0 lb

## 2021-06-18 DIAGNOSIS — I10 Essential (primary) hypertension: Secondary | ICD-10-CM

## 2021-06-18 NOTE — Patient Instructions (Signed)

## 2021-06-18 NOTE — Assessment & Plan Note (Signed)
Blood pressure elevated in office today, patient did have recent visit with Pharm.D. through cardiology, no medication changes at this time, patient is working to make lifestyle modifications including increasing aerobic exercise, encouraged to continue with this ?Continue with losartan 100 mg ?Recommend intermittent monitoring of blood pressure at home, regular aerobic exercise, DASH diet ?

## 2021-06-18 NOTE — Progress Notes (Signed)
? ?New Patient Office Visit ? ?Subjective   ? ?Patient ID: Curtis Arellano, male    DOB: May 28, 1970  Age: 51 y.o. MRN: [id number] ? ?CC:  ?Chief Complaint  ?Patient presents with  ? New Patient (Initial Visit)  ? ? ?HPI ?Curtis Arellano presents to establish care. Denies any specific concerns today. No recent PCP, looks to be at least 7 years ago in chart. ? ?Hypertension: did recently establish with Cardiology regarding this. Was started on losartan, continues with this at present. He saw PharmD for BP management recently, has follow-up in about 1 month with them to review BP. Has been working on lifestyle modifications as well. Denies CP, HA. ? ?Patient is originally from [place]. 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 Outside of work, patient enjoys playing pickle ball, golf. Exercises about 3 times per week - more so strength but incorporating more aerobic exercise. ? ?Outpatient Encounter Medications as of 06/18/2021  ?Medication Sig  ? losartan (COZAAR) 100 MG tablet Take 1 tablet (100 mg total) by mouth daily.  ? rosuvastatin (CRESTOR) 10 MG tablet Take 1 tablet (10 mg total) by mouth daily.  ? ?No facility-administered encounter medications on file as of 06/18/2021.  ? ? ?Past Medical History:  ?Diagnosis Date  ? Anxiety   ? GERD (gastroesophageal reflux disease)   ? Incarcerated umbilical hernia 04/2012  ? Left knee pain   ? Normal cardiac stress test 2013  ? Refusal of blood transfusions as patient is Jehovah's Witness 02/21/11  ? ? ?Past Surgical History:  ?Procedure Laterality Date  ? ANTERIOR CRUCIATE LIGAMENT REPAIR  ~ 1998  ? left knee  ? EYE SURGERY  ~ 2004  ? "growth on right eye cut off"  ? FRACTURE SURGERY  ~ 1992  ? broken nose  ? HERNIA REPAIR    ? INSERTION OF MESH N/A 04/27/2013  ? Procedure: INSERTION OF MESH;  Surgeon: Ernestene Mention, MD;  Location: [facility] OR;  Service: General;  Laterality: N/A;  ? NASAL SEPTUM SURGERY  ~ 1990  ? UMBILICAL HERNIA REPAIR N/A 04/27/2013  ?  Procedure: HERNIA REPAIR UMBILICAL ADULT;  Surgeon: Ernestene Mention, MD;  Location: [facility] OR;  Service: General;  Laterality: N/A;  ? VASECTOMY    ? ? ?Family History  ?Problem Relation Age of Onset  ? Hypertension Father   ? Hyperlipidemia Father   ? Stroke Maternal Grandmother   ? Gallbladder disease Mother   ?     removed  ? Anxiety disorder Mother   ? Ulcers Maternal Grandfather   ?     per pt stomach ulcer with bleeding  ? ? ?Social History  ? ?Socioeconomic History  ? Marital status: Married  ?  Spouse name: Not on file  ? Number of children: Not on file  ? Years of education: Not on file  ? Highest education level: Not on file  ?Occupational History  ? Occupation: IT specialist  ?  Employer: [employer]   ?Tobacco Use  ? Smoking status: Never  ? Smokeless tobacco: Never  ?Substance and Sexual Activity  ? Alcohol use: Yes  ?  Alcohol/week: 4.0 standard drinks  ?  Types: 4 Cans of beer per week  ? Drug use: No  ? Sexual activity: Yes  ?  Birth control/protection: Surgical  ?  Comment: married  ?Other Topics Concern  ? Not on file  ?Social History Narrative  ? Per blue health survey form - pt does not exercise  ? ?  Social Determinants of Health  ? ?Financial Resource Strain: Not on file  ?Food Insecurity: Not on file  ?Transportation Needs: Not on file  ?Physical Activity: Not on file  ?Stress: Not on file  ?Social Connections: Not on file  ?Intimate Partner Violence: Not on file  ? ? ?Objective   ? ?BP (!) 143/107   Pulse 84   Temp 97.7 ?F (36.5 ?C)   Ht 5\' 6"  (1.676 m)   Wt 200 lb (90.7 kg)   SpO2 99%   BMI 32.28 kg/m?  ? ?Physical Exam ? ?51 yo male in no acute distress ?Cardiovascular exam with regular rate and rhythm, no murmur appreciated ?Lungs clear to auscultation bilaterally ? ?Assessment & Plan:  ? ?Problem List Items Addressed This Visit   ? ?  ? Cardiovascular and Mediastinum  ? Hypertension - Primary  ?  Blood pressure elevated in office today, patient did have recent visit with Pharm.D.  through cardiology, no medication changes at this time, patient is working to make lifestyle modifications including increasing aerobic exercise, encouraged to continue with this ?Continue with losartan 100 mg ?Recommend intermittent monitoring of blood pressure at home, regular aerobic exercise, DASH diet ? ?  ?  ? ? ?Return in about 4 weeks (around 07/16/2021) for CPE no labs needed.  ? ?Tristin Vandeusen J De 07/18/2021, MD ? ?

## 2021-07-09 ENCOUNTER — Ambulatory Visit (INDEPENDENT_AMBULATORY_CARE_PROVIDER_SITE_OTHER): Payer: 59 | Admitting: Pharmacist

## 2021-07-09 VITALS — BP 122/80 | HR 72

## 2021-07-09 DIAGNOSIS — I10 Essential (primary) hypertension: Secondary | ICD-10-CM | POA: Diagnosis not present

## 2021-07-09 NOTE — Progress Notes (Signed)
Patient ID: Curtis Arellano                 DOB: 03-03-1971                      MRN: [id number] ? ? ? ? ?HPI: ?Curtis Arellano is a 51 y.o. male referred by Dr. Eden Emms to HTN clinic. PMH is significant for HTN, GERD, HLD and anxiety. Patient last saw Dr. Eden Emms on 05/22/21. His blood pressure was 160/98. Losartan 100mg  daily was started. He was encouraged to decrease alcohol intake.  ? ?At last visit in HTN clinic, blood pressure was 130/90. He opted to increase his aerobic exercise and follow up in 1 month.  ? ?Patient presents today to HTN clinic. He endorses tolerating losartan well. He denies dizziness, lightheadedness, headache, blurred vision, SOB, chest pain, palpitations or swelling. He takes IBU only ocassionally if he needs prior to golf for his back. Still playing pickleball, but not as much due to his back. Has started either using the rower or the elpipitcal at the gym 3 days a week for 30-45 min. He lifts weights the other 3 days. Home BP cuff is an upper arm [facility] .  ? ?Home BP cuff:154/99 ?Office cuff: 122/82 ?Home BP cuff: 147/91 ?Clinic automated cuff: 122/80 ? ?Current HTN meds: losartan 100mg  daily ?Previously tried: none ?BP goal: <130/80 ? ?Family History:  ?Family History  ?Problem Relation Age of Onset  ? Hypertension Father   ? Hyperlipidemia Father   ? Stroke Maternal Grandmother   ? Gallbladder disease Mother   ?     removed  ? Anxiety disorder Mother   ? Ulcers Maternal Grandfather   ?     per pt stomach ulcer with bleeding  ? ? ? ?Social History: 2 shots of gin per night, no tobacco, no illict drugs ? ?Diet: not addressed today ? ?Exercise: golf, gym 3 times x week for strength training, elipitcal, rowing machine 30-27min (3 days a week), pickleball (not as much as before) ? ?Home BP readings: 120/79, 121/76, 126/71, 142/91, 116/75, 114/69, 125/71, 135/79, 134/82, 1, 36/79, 126/72, 120/76, 140/83, 128/79, 135/78 ? ?Wt Readings from Last 3 Encounters:  ?06/18/21 200 lb  (90.7 kg)  ?05/22/21 200 lb (90.7 kg)  ?04/25/14 194 lb (88 kg)  ? ?BP Readings from Last 3 Encounters:  ?06/18/21 (!) 143/107  ?06/14/21 130/90  ?05/22/21 (!) 160/98  ? ?Pulse Readings from Last 3 Encounters:  ?06/18/21 84  ?06/14/21 79  ?05/22/21 68  ? ? ?Renal function: ?CrCl cannot be calculated (Patient's most recent lab result is older than the maximum 21 days allowed.). ? ?Past Medical History:  ?Diagnosis Date  ? Anxiety   ? GERD (gastroesophageal reflux disease)   ? Incarcerated umbilical hernia 04/2012  ? Left knee pain   ? Normal cardiac stress test 2013  ? Refusal of blood transfusions as patient is Jehovah's Witness 02/21/11  ? ? ?Current Outpatient Medications on File Prior to Visit  ?Medication Sig Dispense Refill  ? losartan (COZAAR) 100 MG tablet Take 1 tablet (100 mg total) by mouth daily. 90 tablet 3  ? rosuvastatin (CRESTOR) 10 MG tablet Take 1 tablet (10 mg total) by mouth daily. 90 tablet 3  ? ?No current facility-administered medications on file prior to visit.  ? ? ?Allergies  ?Allergen Reactions  ? Penicillins Rash  ? ? ?There were no vitals taken for this visit. ? ? ?Assessment/Plan: ? ?1. Hypertension - Blood pressure  at goal of <130/80 in clinic today. Home cuff is inaccurate. Running high. He actually had many BP at goal at home. Continue losartan 100mg  daily. I have asked him to pick up a new BP cuff, recommended an OMRON 3 series or Bronze. Continue monitoring BP at home. He was instructed to call me if BP is >130/80 consistently. I will call him in 1 month to follow up. Continue both cardio and strength training. ? ? ?Thank you ? ? , Pharm.D, BCPS, CPP ?[facility]  ?[address], [place], [place]  ?Phone: [phone]; Fax: [phone]  ? ? ?

## 2021-07-09 NOTE — Patient Instructions (Addendum)
Please pick up a new blood pressure cuff. We recommend an OMRON bronze or 3 series ?Continue monitoring BP at home ?Continue losartan 100mg  daily ?Please call me at [phone] with any questions or if BP is >130/80 consistently ?Keep doing your cardio and weight lifting. ?Blood pressure goal is <130/80 ?

## 2021-08-07 ENCOUNTER — Encounter (HOSPITAL_BASED_OUTPATIENT_CLINIC_OR_DEPARTMENT_OTHER): Payer: Self-pay | Admitting: Family Medicine

## 2021-08-07 ENCOUNTER — Ambulatory Visit (INDEPENDENT_AMBULATORY_CARE_PROVIDER_SITE_OTHER): Payer: 59 | Admitting: Family Medicine

## 2021-08-07 VITALS — BP 151/95 | HR 65 | Temp 97.9°F | Ht 66.0 in | Wt 201.9 lb

## 2021-08-07 DIAGNOSIS — Z1211 Encounter for screening for malignant neoplasm of colon: Secondary | ICD-10-CM | POA: Diagnosis not present

## 2021-08-07 DIAGNOSIS — Z0000 Encounter for general adult medical examination without abnormal findings: Secondary | ICD-10-CM

## 2021-08-07 DIAGNOSIS — Z23 Encounter for immunization: Secondary | ICD-10-CM | POA: Diagnosis not present

## 2021-08-07 MED ORDER — SHINGRIX 50 MCG/0.5ML IM SUSR
0.5000 mL | Freq: Once | INTRAMUSCULAR | 0 refills | Status: AC
Start: 2021-08-07 — End: 2021-08-07

## 2021-08-07 NOTE — Progress Notes (Signed)
Subjective:    CC: Annual Physical Exam  HPI:  Curtis Arellano is a 51 y.o. presenting for annual physical  I reviewed the past medical history, family history, social history, surgical history, and allergies today and no changes were needed.  Please see the problem list section below in epic for further details.  Past Medical History: Past Medical History:  Diagnosis Date   Anxiety    GERD (gastroesophageal reflux disease)    Incarcerated umbilical hernia 04/2012   Left knee pain    Normal cardiac stress test 2013   Refusal of blood transfusions as patient is Jehovah's Witness 02/21/11   Past Surgical History: Past Surgical History:  Procedure Laterality Date   ANTERIOR CRUCIATE LIGAMENT REPAIR  ~ 1998   left knee   EYE SURGERY  ~ 2004   "growth on right eye cut off"   FRACTURE SURGERY  ~ 1992   broken nose   HERNIA REPAIR     INSERTION OF MESH N/A 04/27/2013   Procedure: INSERTION OF MESH;  Surgeon: Ernestene Mention, MD;  Location: [facility] OR;  Service: General;  Laterality: N/A;   NASAL SEPTUM SURGERY  ~ 1990   UMBILICAL HERNIA REPAIR N/A 04/27/2013   Procedure: HERNIA REPAIR UMBILICAL ADULT;  Surgeon: Ernestene Mention, MD;  Location: [facility] OR;  Service: General;  Laterality: N/A;   VASECTOMY     Social History: Social History   Socioeconomic History   Marital status: Married    Spouse name: Not on file   Number of children: Not on file   Years of education: Not on file   Highest education level: Not on file  Occupational History   Occupation: IT specialist    Employer: [employer]   Tobacco Use   Smoking status: Never   Smokeless tobacco: Never  Substance and Sexual Activity   Alcohol use: Yes    Alcohol/week: 4.0 standard drinks    Types: 4 Cans of beer per week   Drug use: No   Sexual activity: Yes    Birth control/protection: Surgical    Comment: married  Other Topics Concern   Not on file  Social History Narrative   Per blue health survey form - pt  does not exercise   Social Determinants of Health   Financial Resource Strain: Not on file  Food Insecurity: Not on file  Transportation Needs: Not on file  Physical Activity: Not on file  Stress: Not on file  Social Connections: Not on file   Family History: Family History  Problem Relation Age of Onset   Hypertension Father    Hyperlipidemia Father    Stroke Maternal Grandmother    Gallbladder disease Mother        removed   Anxiety disorder Mother    Ulcers Maternal Grandfather        per pt stomach ulcer with bleeding   Allergies: Allergies  Allergen Reactions   Penicillins Rash   Medications: See med rec.  Review of Systems: No headache, visual changes, nausea, vomiting, diarrhea, constipation, dizziness, abdominal pain, skin rash, fevers, chills, night sweats, swollen lymph nodes, weight loss, chest pain, body aches, joint swelling, muscle aches, shortness of breath, mood changes, visual or auditory hallucinations.  Objective:    BP (!) 151/95   Pulse 65   Temp 97.9 F (36.6 C) (Oral)   Ht 5\' 6"  (1.676 m)   Wt 201 lb 14.4 oz (91.6 kg)   SpO2 100%   BMI 32.59 kg/m  General: Well Developed, well nourished, and in no acute distress.  Neuro: Alert and oriented x3, extra-ocular muscles intact, sensation grossly intact. Cranial nerves II through XII are intact, motor, sensory, and coordinative functions are all intact. HEENT: Normocephalic, atraumatic, pupils equal round reactive to light, neck supple, no masses, no lymphadenopathy, thyroid nonpalpable. Oropharynx, nasopharynx, external ear canals are unremarkable. Skin: Warm and dry, no rashes noted.  Cardiac: Regular rate and rhythm, no murmurs rubs or gallops.  Respiratory: Clear to auscultation bilaterally. Not using accessory muscles, speaking in full sentences.  Abdominal: Soft, nontender, nondistended, positive bowel sounds, no masses, no organomegaly.  Musculoskeletal: Shoulder, elbow, wrist, hip, knee,  ankle stable, and with full range of motion.  Impression and Recommendations:    Wellness examination Routine HCM labs reviewed. HCM reviewed/discussed. Anticipatory guidance regarding healthy weight, lifestyle and choices given.  Recommend healthy diet.  Recommend approximately 150 minutes/week of moderate intensity exercise Recommend regular dental and vision exams Always use seatbelt/lap and shoulder restraints Recommend using smoke alarms and checking batteries at least twice a year Recommend using sunscreen when outside Discussed colon cancer screening recommendations, options.  Patient would like to proceed colonoscopy, referral placed Discussed recommendations for shingles vaccine.  Patient amenable, Rx sent to pharmacy Discussed tetanus immunization recommendations, patient is UTD, due later this year The natural history of prostate cancer and ongoing controversy regarding screening and potential treatment outcomes of prostate cancer has been discussed with the patient. The meaning of a false positive PSA and a false negative PSA has been discussed. He indicates understanding of the limitations of this screening test and wishes to proceed with screening PSA testing - done recently through Cardiology, was normal  Return in about 6 months (around 02/06/2022) for HTN, HLD.   ___________________________________________ Mariella Blackwelder de [place], MD, ABFM, [facility]

## 2021-08-07 NOTE — Assessment & Plan Note (Addendum)
Routine HCM labs reviewed. HCM reviewed/discussed. Anticipatory guidance regarding healthy weight, lifestyle and choices given.  Recommend healthy diet.  Recommend approximately 150 minutes/week of moderate intensity exercise Recommend regular dental and vision exams Always use seatbelt/lap and shoulder restraints Recommend using smoke alarms and checking batteries at least twice a year Recommend using sunscreen when outside Discussed colon cancer screening recommendations, options.  Patient would like to proceed colonoscopy, referral placed Discussed recommendations for shingles vaccine.  Patient amenable, Rx sent to pharmacy Discussed tetanus immunization recommendations, patient is UTD, due later this year The natural history of prostate cancer and ongoing controversy regarding screening and potential treatment outcomes of prostate cancer has been discussed with the patient. The meaning of a false positive PSA and a false negative PSA has been discussed. He indicates understanding of the limitations of this screening test and wishes to proceed with screening PSA testing - done recently through Cardiology, was normal

## 2021-08-19 ENCOUNTER — Telehealth: Payer: Self-pay | Admitting: Pharmacist

## 2021-08-19 NOTE — Telephone Encounter (Signed)
Called pt to see how his blood pressure was doing. States he forgot to pick up a new BP cuff. He is on vacation right now, but will buy one when he gets back and will send me readings.

## 2021-08-28 ENCOUNTER — Ambulatory Visit: Payer: 59

## 2021-08-28 ENCOUNTER — Other Ambulatory Visit: Payer: 59

## 2021-08-28 NOTE — Progress Notes (Deleted)
Patient ID: Curtis Arellano                 DOB: 1970/05/02                      MRN: [id number]     HPI: Curtis Arellano is a 51 y.o. male referred by Dr. Eden Emms to HTN clinic. PMH is significant for HTN, GERD, HLD and anxiety. Patient last saw Dr. Eden Emms on 05/22/21. His blood pressure was 160/98. Losartan 100mg  daily was started. He was encouraged to decrease alcohol intake.   At last visit in HTN clinic, blood pressure was 130/90. He opted to increase his aerobic exercise and follow up in 1 month.   Patient presents today to HTN clinic. He endorses tolerating losartan well. He denies dizziness, lightheadedness, headache, blurred vision, SOB, chest pain, palpitations or swelling. He takes IBU only ocassionally if he needs prior to golf for his back. Still playing pickleball, but not as much due to his back. Has started either using the rower or the elpipitcal at the gym 3 days a week for 30-45 min. He lifts weights the other 3 days. Home BP cuff is an upper arm [facility] .   Home BP cuff:154/99 Office cuff: 122/82 Home BP cuff: 147/91 Clinic automated cuff: 122/80  Current HTN meds: losartan 100mg  daily Previously tried: none BP goal: <130/80  Family History:  Family History  Problem Relation Age of Onset   Hypertension Father    Hyperlipidemia Father    Stroke Maternal Grandmother    Gallbladder disease Mother        removed   Anxiety disorder Mother    Ulcers Maternal Grandfather        per pt stomach ulcer with bleeding     Social History: 2 shots of gin per night, no tobacco, no illict drugs  Diet: not addressed today  Exercise: golf, gym 3 times x week for strength training, elipitcal, rowing machine 30-12min (3 days a week), pickleball (not as much as before)  Home BP readings: 120/79, 121/76, 126/71, 142/91, 116/75, 114/69, 125/71, 135/79, 134/82, 1, 36/79, 126/72, 120/76, 140/83, 128/79, 135/78  Wt Readings from Last 3 Encounters:  08/07/21 201 lb  14.4 oz (91.6 kg)  06/18/21 200 lb (90.7 kg)  05/22/21 200 lb (90.7 kg)   BP Readings from Last 3 Encounters:  08/07/21 (!) 151/95  07/09/21 122/80  06/18/21 (!) 143/107   Pulse Readings from Last 3 Encounters:  08/07/21 65  07/09/21 72  06/18/21 84    Renal function: CrCl cannot be calculated (Patient's most recent lab result is older than the maximum 21 days allowed.).  Past Medical History:  Diagnosis Date   Anxiety    GERD (gastroesophageal reflux disease)    Incarcerated umbilical hernia 04/2012   Left knee pain    Normal cardiac stress test 2013   Refusal of blood transfusions as patient is Jehovah's Witness 02/21/11    Current Outpatient Medications on File Prior to Visit  Medication Sig Dispense Refill   diclofenac (VOLTAREN) 75 MG EC tablet Take 75 mg by mouth 2 (two) times daily.     losartan (COZAAR) 100 MG tablet Take 1 tablet (100 mg total) by mouth daily. 90 tablet 3   rosuvastatin (CRESTOR) 10 MG tablet Take 1 tablet (10 mg total) by mouth daily. 90 tablet 3   No current facility-administered medications on file prior to visit.    Allergies  Allergen Reactions   Penicillins  Rash    There were no vitals taken for this visit.   Assessment/Plan:  1. Hypertension - Blood pressure at goal of <130/80 in clinic today. Home cuff is inaccurate. Running high. He actually had many BP at goal at home. Continue losartan 100mg  daily. I have asked him to pick up a new BP cuff, recommended an OMRON 3 series or Bronze. Continue monitoring BP at home. He was instructed to call me if BP is >130/80 consistently. I will call him in 1 month to follow up. Continue both cardio and strength training.   Thank you  , Pharm.D, BCPS, CPP [facility]  [address] [place], [place]  Phone: [phone]; Fax: [phone]

## 2021-08-30 ENCOUNTER — Ambulatory Visit (AMBULATORY_SURGERY_CENTER): Payer: Self-pay | Admitting: *Deleted

## 2021-08-30 VITALS — Ht 66.0 in | Wt 203.0 lb

## 2021-08-30 DIAGNOSIS — Z1211 Encounter for screening for malignant neoplasm of colon: Secondary | ICD-10-CM

## 2021-08-30 MED ORDER — NA SULFATE-K SULFATE-MG SULF 17.5-3.13-1.6 GM/177ML PO SOLN: 1.0000 | ORAL | 0 refills | Status: DC

## 2021-08-30 NOTE — Progress Notes (Signed)
Patient is here in-person for PV. Patient denies any allergies to eggs or soy. Patient denies any problems with anesthesia/sedation. Patient is not on any oxygen at home. Patient is not taking any diet/weight loss medications or blood thinners. Went over procedure prep instructions with the patient. Patient is aware of our care-partner policy. Patient notified to use Good-Rx CARD given for prescription.   EMMI education assigned to the patient for the procedure, sent to MyChart.

## 2021-09-02 ENCOUNTER — Other Ambulatory Visit: Payer: 59

## 2021-09-06 ENCOUNTER — Other Ambulatory Visit: Payer: Self-pay

## 2021-09-06 ENCOUNTER — Other Ambulatory Visit: Payer: 59 | Admitting: *Deleted

## 2021-09-06 DIAGNOSIS — Z79899 Other long term (current) drug therapy: Secondary | ICD-10-CM

## 2021-09-06 DIAGNOSIS — I10 Essential (primary) hypertension: Secondary | ICD-10-CM

## 2021-09-06 DIAGNOSIS — E7800 Pure hypercholesterolemia, unspecified: Secondary | ICD-10-CM

## 2021-09-06 LAB — LIPID PANEL
Chol/HDL Ratio: 3.3 ratio (ref 0.0–5.0)
Cholesterol, Total: 195 mg/dL (ref 100–199)
HDL: 60 mg/dL (ref >39)
LDL Chol Calc (NIH): 111 mg/dL — ABNORMAL HIGH (ref 0–99)
Triglycerides: 139 mg/dL (ref 0–149)
VLDL Cholesterol Cal: 24 mg/dL (ref 5–40)

## 2021-09-06 LAB — HEPATIC FUNCTION PANEL
ALT: 38 IU/L (ref 0–44)
AST: 15 IU/L (ref 0–40)
Albumin: 4.6 g/dL (ref 3.8–4.9)
Alkaline Phosphatase: 49 IU/L (ref 44–121)
Bilirubin Total: 0.4 mg/dL (ref 0.0–1.2)
Bilirubin, Direct: 0.11 mg/dL (ref 0.00–0.40)
Total Protein: 6.5 g/dL (ref 6.0–8.5)

## 2021-09-10 ENCOUNTER — Encounter: Payer: Self-pay | Admitting: Pharmacist

## 2021-09-11 NOTE — Telephone Encounter (Signed)
Spoke with patient. He has not picked up a new BP cuff yet. States he will prioritize that and get one this week. He is agreeable to increasing rosuvastatin to 20mg  daily. He will take 2 of his 10mg  tablets. Recheck labs in 3 months.

## 2021-09-15 ENCOUNTER — Encounter: Payer: Self-pay | Admitting: Certified Registered Nurse Anesthetist

## 2021-09-17 ENCOUNTER — Encounter: Payer: Self-pay | Admitting: Gastroenterology

## 2021-09-20 ENCOUNTER — Ambulatory Visit (AMBULATORY_SURGERY_CENTER): Payer: 59 | Admitting: Gastroenterology

## 2021-09-20 ENCOUNTER — Encounter: Payer: Self-pay | Admitting: Gastroenterology

## 2021-09-20 VITALS — BP 123/78 | HR 64 | Temp 98.9°F | Resp 19 | Ht 66.0 in | Wt 203.0 lb

## 2021-09-20 DIAGNOSIS — Z1211 Encounter for screening for malignant neoplasm of colon: Secondary | ICD-10-CM | POA: Diagnosis present

## 2021-09-20 MED ORDER — SODIUM CHLORIDE 0.9 % IV SOLN: 500.0000 mL | Freq: Once | INTRAVENOUS | Status: DC

## 2021-09-20 NOTE — Progress Notes (Signed)
Pt's states no medical or surgical changes since previsit or office visit. 

## 2021-09-20 NOTE — Progress Notes (Signed)
Report given to PACU, vss 

## 2021-09-20 NOTE — Patient Instructions (Addendum)

## 2021-09-20 NOTE — Op Note (Signed)
[place] Patient Name: Datavious Kyger Procedure Date: 09/20/2021 1:14 PM MRN: [id number] Endoscopist: Milus Banister , MD Age: 51 Referring MD:  Date of Birth: 08-20-1970 Gender: Male Account #: [id number] Procedure:                Colonoscopy Indications:              Screening for colorectal malignant neoplasm Medicines:                Monitored Anesthesia Care Procedure:                Pre-Anesthesia Assessment:                           - Prior to the procedure, a History and Physical                            was performed, and patient medications and                            allergies were reviewed. The patient's tolerance of                            previous anesthesia was also reviewed. The risks                            and benefits of the procedure and the sedation                            options and risks were discussed with the patient.                            All questions were answered, and informed consent                            was obtained. Prior Anticoagulants: The patient has                            taken no previous anticoagulant or antiplatelet                            agents. ASA Grade Assessment: II - A patient with                            mild systemic disease. After reviewing the risks                            and benefits, the patient was deemed in                            satisfactory condition to undergo the procedure.                           After obtaining informed consent, the colonoscope  was passed under direct vision. Throughout the                            procedure, the patient's blood pressure, pulse, and                            oxygen saturations were monitored continuously. The                            CF [id number] #[id number] was introduced through the anus                            and advanced to the the cecum, identified by                            appendiceal  orifice and ileocecal valve. The                            colonoscopy was performed without difficulty. The                            patient tolerated the procedure well. The quality                            of the bowel preparation was good. The ileocecal                            valve, appendiceal orifice, and rectum were                            photographed. Scope In: 1:15:15 PM Scope Out: 1:22:46 PM Scope Withdrawal Time: 0 hours 5 minutes 55 seconds  Total Procedure Duration: 0 hours 7 minutes 31 seconds  Findings:                 The entire examined colon appeared normal on direct                            and retroflexion views. Complications:            No immediate complications. Estimated blood loss:                            None. Estimated Blood Loss:     Estimated blood loss: none. Impression:               - The entire examined colon is normal on direct and                            retroflexion views.                           - No polyps or cancers. Recommendation:           - Patient has a contact number available for  emergencies. The signs and symptoms of potential                            delayed complications were discussed with the                            patient. Return to normal activities tomorrow.                            Written discharge instructions were provided to the                            patient.                           - Resume previous diet.                           - Continue present medications.                           - Repeat colonoscopy in 10 years for screening. Rachael Fee, MD 09/20/2021 1:24:28 PM This report has been signed electronically.

## 2021-09-20 NOTE — Progress Notes (Signed)
HPI: This is a man at routine risk for CRC   ROS: complete GI ROS as described in HPI, all other review negative.  Constitutional:  No unintentional weight loss   Past Medical History:  Diagnosis Date   Anxiety    GERD (gastroesophageal reflux disease)    Incarcerated umbilical hernia 04/2012   Left knee pain    Normal cardiac stress test 2013   Refusal of blood transfusions as patient is Jehovah's Witness 02/21/11    Past Surgical History:  Procedure Laterality Date   ANTERIOR CRUCIATE LIGAMENT REPAIR  ~ 1998   left knee   EYE SURGERY  ~ 2004   "growth on right eye cut off"   FRACTURE SURGERY  ~ 1992   broken nose   HERNIA REPAIR     INSERTION OF MESH N/A 04/27/2013   Procedure: INSERTION OF MESH;  Surgeon: Ernestene Mention, MD;  Location: [facility] OR;  Service: General;  Laterality: N/A;   NASAL SEPTUM SURGERY  ~ 1990   UMBILICAL HERNIA REPAIR N/A 04/27/2013   Procedure: HERNIA REPAIR UMBILICAL ADULT;  Surgeon: Ernestene Mention, MD;  Location: [facility] OR;  Service: General;  Laterality: N/A;   UPPER GASTROINTESTINAL ENDOSCOPY  2013   Dr.Mann   VASECTOMY      Current Outpatient Medications  Medication Sig Dispense Refill   diclofenac (VOLTAREN) 75 MG EC tablet Take 75 mg by mouth 2 (two) times daily.     losartan (COZAAR) 100 MG tablet Take 1 tablet (100 mg total) by mouth daily. 90 tablet 3   rosuvastatin (CRESTOR) 20 MG tablet Take 1 tablet (20 mg total) by mouth daily. 90 tablet 3   Current Facility-Administered Medications  Medication Dose Route Frequency Provider Last Rate Last Admin   0.9 %  sodium chloride infusion  500 mL Intravenous Once Rachael Fee, MD        Allergies as of 09/20/2021 - Review Complete 09/20/2021  Allergen Reaction Noted   Penicillins Rash 01/12/2012    Family History  Problem Relation Age of Onset   Gallbladder disease Mother        removed   Anxiety disorder Mother    Hypertension Father    Hyperlipidemia Father    Stroke Maternal  Grandmother    Ulcers Maternal Grandfather        per pt stomach ulcer with bleeding   Colon cancer Neg Hx    Colon polyps Neg Hx    Esophageal cancer Neg Hx    Stomach cancer Neg Hx    Rectal cancer Neg Hx     Social History   Socioeconomic History   Marital status: Married    Spouse name: Not on file   Number of children: Not on file   Years of education: Not on file   Highest education level: Not on file  Occupational History   Occupation: IT specialist    Employer: [employer]   Tobacco Use   Smoking status: Never   Smokeless tobacco: Never  Vaping Use   Vaping Use: Never used  Substance and Sexual Activity   Alcohol use: Yes    Alcohol/week: 5.0 - 6.0 standard drinks of alcohol    Types: 5 - 6 Standard drinks or equivalent per week   Drug use: No   Sexual activity: Yes    Birth control/protection: Surgical    Comment: married  Other Topics Concern   Not on file  Social History Narrative   Per blue health survey form -  pt does not exercise   Social Determinants of Health   Financial Resource Strain: Not on file  Food Insecurity: Not on file  Transportation Needs: Not on file  Physical Activity: Not on file  Stress: Not on file  Social Connections: Not on file  Intimate Partner Violence: Not on file     Physical Exam: BP 134/85   Pulse 82   Temp 98.9 F (37.2 C)   Ht 5\' 6"  (1.676 m)   Wt 203 lb (92.1 kg)   SpO2 97%   BMI 32.77 kg/m  Constitutional: generally well-appearing Psychiatric: alert and oriented x3 Lungs: CTA bilaterally Heart: no MCR  Assessment and plan: 51 y.o. male with routine risk for CRC  Screening colonoscopy today  Care is appropriate for the ambulatory setting.  44, MD [facility] 09/20/2021, 12:40 PM

## 2021-09-23 ENCOUNTER — Telehealth: Payer: Self-pay

## 2021-09-23 NOTE — Telephone Encounter (Signed)
  Follow up Call-     09/20/2021   12:34 PM  Call back number  Post procedure Call Back phone  # [phone]  Permission to leave phone message Yes     Patient questions:  Do you have a fever, pain , or abdominal swelling? No. Pain Score  0 *  Have you tolerated food without any problems? Yes.    Have you been able to return to your normal activities? Yes.    Do you have any questions about your discharge instructions: Diet   No. Medications  No. Follow up visit  No.  Do you have questions or concerns about your Care? No.  Actions: * If pain score is 4 or above: No action needed, pain <4.

## 2021-11-08 ENCOUNTER — Other Ambulatory Visit: Payer: Self-pay

## 2021-11-08 MED ORDER — ROSUVASTATIN CALCIUM 20 MG PO TABS: 20.0000 mg | ORAL_TABLET | Freq: Every day | ORAL | 1 refills | Status: DC

## 2021-11-08 NOTE — Telephone Encounter (Signed)
Pt's medication was sent to pt's pharmacy as requested. Confirmation received.  °

## 2021-12-26 ENCOUNTER — Encounter: Payer: Self-pay | Admitting: Pharmacist

## 2022-01-14 MED ORDER — LOSARTAN POTASSIUM 100 MG PO TABS: 100.0000 mg | ORAL_TABLET | Freq: Every day | ORAL | 3 refills | Status: DC

## 2022-02-06 ENCOUNTER — Ambulatory Visit (HOSPITAL_BASED_OUTPATIENT_CLINIC_OR_DEPARTMENT_OTHER): Payer: BLUE CROSS/BLUE SHIELD | Admitting: Family Medicine

## 2022-02-06 ENCOUNTER — Encounter (HOSPITAL_BASED_OUTPATIENT_CLINIC_OR_DEPARTMENT_OTHER): Payer: Self-pay | Admitting: Family Medicine

## 2022-02-06 ENCOUNTER — Ambulatory Visit (INDEPENDENT_AMBULATORY_CARE_PROVIDER_SITE_OTHER): Payer: 59 | Admitting: Family Medicine

## 2022-02-06 VITALS — BP 147/100 | HR 85 | Ht 66.0 in | Wt 200.0 lb

## 2022-02-06 DIAGNOSIS — R0981 Nasal congestion: Secondary | ICD-10-CM | POA: Diagnosis not present

## 2022-02-06 DIAGNOSIS — J069 Acute upper respiratory infection, unspecified: Secondary | ICD-10-CM | POA: Insufficient documentation

## 2022-02-06 LAB — POC SOFIA SARS ANTIGEN FIA: SARS Coronavirus 2 Ag: NEGATIVE

## 2022-02-06 LAB — POCT INFLUENZA A/B
Influenza A, POC: NEGATIVE
Influenza B, POC: NEGATIVE

## 2022-02-06 NOTE — Assessment & Plan Note (Signed)
Patient reports that about 4 days ago, he began to experience cough and shortly thereafter developed some sweats and did experience fever for a couple days.  He does report sick contacts with family members who were sick that he had been around a few days before symptoms began.  He has been utilizing OTC medications to help with symptoms.  He has been about 2 days without fever now.  He does feel that today he is feeling somewhat improved, continues to have cough.  He additionally had some bodyaches, chills. On exam, patient is in no acute distress, vital signs stable, patient is afebrile today.  Cardiovascular exam with regular rate and rhythm, lungs clear to auscultation bilaterally.  External auditory canals clear bilaterally, normal-appearing tympanic membranes.  Mild pharyngeal erythema, no significant cervical lymphadenopathy. Suspect acute viral illness, we did complete point-of-care testing today for flu and coronavirus, both of which were negative. Recommend continue with conservative measures and treating as viral illness, no role for antibiotics at this time.  Discussed recommendations for conservative measures, can utilize honey as well to help with cough.  Could also consider Jerilynn Som, he will let us know if he would like prescription sent to pharmacy Would expect symptoms to continue improving over the next few days.  Did discuss that occasionally, cough may linger for up to 4 weeks or so

## 2022-02-06 NOTE — Progress Notes (Signed)
    Procedures performed today:    None.  Independent interpretation of notes and tests performed by another provider:   None.  Brief History, Exam, Impression, and Recommendations:    BP (!) 147/100 (BP Location: Left Arm, Patient Position: Sitting, Cuff Size: Large)   Pulse 85   Ht 5\' 6"  (1.676 m)   Wt 200 lb (90.7 kg)   SpO2 97%   BMI 32.28 kg/m   URI (upper respiratory infection) Patient reports that about 4 days ago, he began to experience cough and shortly thereafter developed some sweats and did experience fever for a couple days.  He does report sick contacts with family members who were sick that he had been around a few days before symptoms began.  He has been utilizing OTC medications to help with symptoms.  He has been about 2 days without fever now.  He does feel that today he is feeling somewhat improved, continues to have cough.  He additionally had some bodyaches, chills. On exam, patient is in no acute distress, vital signs stable, patient is afebrile today.  Cardiovascular exam with regular rate and rhythm, lungs clear to auscultation bilaterally.  External auditory canals clear bilaterally, normal-appearing tympanic membranes.  Mild pharyngeal erythema, no significant cervical lymphadenopathy. Suspect acute viral illness, we did complete point-of-care testing today for flu and coronavirus, both of which were negative. Recommend continue with conservative measures and treating as viral illness, no role for antibiotics at this time.  Discussed recommendations for conservative measures, can utilize honey as well to help with cough.  Could also consider , he will let Jerilynn Som know if he would like prescription sent to pharmacy Would expect symptoms to continue improving over the next few days.  Did discuss that occasionally, cough may linger for up to 4 weeks or so  Return if symptoms worsen or fail to  improve.   ___________________________________________ Curtis Hernandez de [place], MD, ABFM, [facility] and Sports Medicine [facility]

## 2022-02-06 NOTE — Patient Instructions (Signed)

## 2022-03-18 ENCOUNTER — Ambulatory Visit (INDEPENDENT_AMBULATORY_CARE_PROVIDER_SITE_OTHER): Payer: 59 | Admitting: Family Medicine

## 2022-03-18 ENCOUNTER — Encounter (HOSPITAL_BASED_OUTPATIENT_CLINIC_OR_DEPARTMENT_OTHER): Payer: Self-pay | Admitting: Family Medicine

## 2022-03-18 VITALS — BP 159/97 | HR 72 | Ht 66.0 in | Wt 209.4 lb

## 2022-03-18 DIAGNOSIS — E785 Hyperlipidemia, unspecified: Secondary | ICD-10-CM

## 2022-03-18 DIAGNOSIS — I10 Essential (primary) hypertension: Secondary | ICD-10-CM | POA: Diagnosis not present

## 2022-03-18 NOTE — Progress Notes (Signed)
    Procedures performed today:    None.  Independent interpretation of notes and tests performed by another provider:   None.  Brief History, Exam, Impression, and Recommendations:    BP (!) 159/97   Pulse 72   Ht 5\' 6"  (1.676 m)   Wt 209 lb 6.4 oz (95 kg)   SpO2 100%   BMI 33.80 kg/m   Hypertension Patient presents for follow-up of hypertension.  Patient continues with losartan.  He does have home blood pressure cuff, however has not been checking frequently recently.  His last appointment with cardiology was about 8 months ago.  Previously, it was discussed about possibly adding diuretic or amlodipine to losartan depending on blood pressure control at prior cardiology office visits, blood pressure was better controlled both in the office and on reported home blood pressure readings.  Blood pressure is elevated in this today.  Unfortunately, do not have any blood pressure readings to necessarily compare to in order to further determine blood pressure control. At this time, would recommend that patient reach out to cardiology to schedule follow-up visit as well as monitor blood pressure intermittently at home.  Recommend DASH diet.  Will plan for close follow-up in about 2 months to monitor blood pressure, review updated cardiology recommendations if appointment scheduled with prior to follow-up, determine need for any pharmacotherapy adjustments  Hyperlipidemia Patient continues with rosuvastatin, denies any issues with medication, no reported myalgias.  Most recent lipid panel was a few months ago and certainly showed notable improvement, however still slightly elevated LDL on reading.  In addition to medication, he has been working on lifestyle modifications He will be due for follow-up labs in the near future in order to assess progress Recommend continue with medication at this time as well as lifestyle modifications  Return in about 2 months (around 05/17/2022) for  HTN.   ___________________________________________ Sherisa Gilvin de [place], MD, ABFM, CAQSM Primary Care and [place]

## 2022-03-21 ENCOUNTER — Encounter: Payer: Self-pay | Admitting: Pharmacist

## 2022-03-21 DIAGNOSIS — E785 Hyperlipidemia, unspecified: Secondary | ICD-10-CM | POA: Insufficient documentation

## 2022-03-21 NOTE — Assessment & Plan Note (Signed)
Patient continues with rosuvastatin, denies any issues with medication, no reported myalgias.  Most recent lipid panel was a few months ago and certainly showed notable improvement, however still slightly elevated LDL on reading.  In addition to medication, he has been working on lifestyle modifications He will be due for follow-up labs in the near future in order to assess progress Recommend continue with medication at this time as well as lifestyle modifications

## 2022-03-21 NOTE — Assessment & Plan Note (Signed)
Patient presents for follow-up of hypertension.  Patient continues with losartan.  He does have home blood pressure cuff, however has not been checking frequently recently.  His last appointment with cardiology was about 8 months ago.  Previously, it was discussed about possibly adding diuretic or amlodipine to losartan depending on blood pressure control at prior cardiology office visits, blood pressure was better controlled both in the office and on reported home blood pressure readings.  Blood pressure is elevated in this today.  Unfortunately, do not have any blood pressure readings to necessarily compare to in order to further determine blood pressure control. At this time, would recommend that patient reach out to cardiology to schedule follow-up visit as well as monitor blood pressure intermittently at home.  Recommend DASH diet.  Will plan for close follow-up in about 2 months to monitor blood pressure, review updated cardiology recommendations if appointment scheduled with prior to follow-up, determine need for any pharmacotherapy adjustments

## 2022-05-10 ENCOUNTER — Other Ambulatory Visit: Payer: Self-pay | Admitting: Cardiovascular Disease

## 2022-05-16 ENCOUNTER — Ambulatory Visit (HOSPITAL_BASED_OUTPATIENT_CLINIC_OR_DEPARTMENT_OTHER): Payer: 59 | Admitting: Family Medicine

## 2022-06-11 ENCOUNTER — Other Ambulatory Visit: Payer: Self-pay | Admitting: Cardiovascular Disease

## 2022-07-07 ENCOUNTER — Encounter: Payer: Self-pay | Admitting: Pharmacist

## 2022-07-07 MED ORDER — IRBESARTAN 300 MG PO TABS: 300.0000 mg | ORAL_TABLET | Freq: Every day | ORAL | 0 refills | Status: DC

## 2022-07-07 MED ORDER — AMLODIPINE BESYLATE 2.5 MG PO TABS: 2.5000 mg | ORAL_TABLET | Freq: Every day | ORAL | 0 refills | Status: DC

## 2022-07-15 ENCOUNTER — Other Ambulatory Visit: Payer: Self-pay | Admitting: Cardiovascular Disease

## 2022-07-24 ENCOUNTER — Ambulatory Visit: Payer: 59

## 2022-07-30 ENCOUNTER — Ambulatory Visit: Payer: 59 | Attending: Cardiology | Admitting: Pharmacist

## 2022-07-30 VITALS — BP 130/80 | HR 69

## 2022-07-30 DIAGNOSIS — E785 Hyperlipidemia, unspecified: Secondary | ICD-10-CM

## 2022-07-30 DIAGNOSIS — I10 Essential (primary) hypertension: Secondary | ICD-10-CM

## 2022-07-30 MED ORDER — AMLODIPINE BESYLATE 5 MG PO TABS: 5.0000 mg | ORAL_TABLET | Freq: Every day | ORAL | 3 refills | Status: DC

## 2022-07-30 NOTE — Progress Notes (Signed)
Patient ID: Curtis Arellano                 DOB: 12-May-1970                      MRN: [id number]     HPI: Curtis Arellano is a 52 y.o. male referred by Dr. Eden Emms to HTN clinic. PMH is significant for HTN, GERD, HLD and anxiety. Patient last saw Dr. Eden Emms on 05/22/21. His blood pressure was 160/98. Losartan 100mg  daily was started. He was encouraged to decrease alcohol intake.   At inital visit in HTN clinic, blood pressure was 130/90. He opted to increase his aerobic exercise and follow up in 1 month.   Last seen in HTN 07/09/21. His home cuff was found to be inaccurate and he was asked to get a new BP cuff. He sent blood pressure readings 07/07/22. Losartan was stopped and irbesartan 300mg  daily was started. He was also started on amlodipine 2.5mg  daily.  Patient presents today for follow-up.  He denies any dizziness, lightheadedness, headache, blurred vision, SOB or swelling.  He only has 1 blood pressure reading of 124/87.  He was at the beach last week and and only checked it once.  He did get a new blood pressure cuff an Omron.  He is still exercising.  Drinking less alcohol than prior.  Tolerating new medications fine.  Only takes diclofenac once a day.  Current HTN meds: irbesartan 300mg  daily, amlodipine 2.5mg   Previously tried: none BP goal: <130/80  Family History:  Family History  Problem Relation Age of Onset   Gallbladder disease Mother        removed   Anxiety disorder Mother    Hypertension Father    Hyperlipidemia Father    Stroke Maternal Grandmother    Ulcers Maternal Grandfather        per pt stomach ulcer with bleeding   Colon cancer Neg Hx    Colon polyps Neg Hx    Esophageal cancer Neg Hx    Stomach cancer Neg Hx    Rectal cancer Neg Hx      Social History: 2 shots 3-4 nights per week, no tobacco, no illict drugs  Diet: not addressed today  Exercise: golf, gym 3 times x week for strength training, elipitcal, run/walk 30-50min (3 days a week), pickleball  (not as much as before)  Home BP readings: 124/87   Wt Readings from Last 3 Encounters:  03/18/22 209 lb 6.4 oz (95 kg)  02/06/22 200 lb (90.7 kg)  09/20/21 203 lb (92.1 kg)   BP Readings from Last 3 Encounters:  03/18/22 (!) 159/97  02/06/22 (!) 147/100  09/20/21 123/78   Pulse Readings from Last 3 Encounters:  03/18/22 72  02/06/22 85  09/20/21 64    Renal function: CrCl cannot be calculated (Patient's most recent lab result is older than the maximum 21 days allowed.).  Past Medical History:  Diagnosis Date   Anxiety    GERD (gastroesophageal reflux disease)    Incarcerated umbilical hernia 04/2012   Left knee pain    Normal cardiac stress test 2013   Refusal of blood transfusions as patient is Jehovah's Witness 02/21/11    Current Outpatient Medications on File Prior to Visit  Medication Sig Dispense Refill   amLODipine (NORVASC) 2.5 MG tablet Take 1 tablet (2.5 mg total) by mouth daily. 90 tablet 0   diclofenac (VOLTAREN) 75 MG EC tablet Take 75 mg by mouth 2 (  two) times daily.     irbesartan (AVAPRO) 300 MG tablet Take 1 tablet (300 mg total) by mouth daily. 90 tablet 0   rosuvastatin (CRESTOR) 20 MG tablet TAKE 1 TABLET(20 MG) BY MOUTH DAILY 30 tablet 0   No current facility-administered medications on file prior to visit.    Allergies  Allergen Reactions   Penicillins Rash    There were no vitals taken for this visit.   Assessment/Plan:  1. Hypertension - Blood pressure above goal of less than 130/80.  Only 1 home blood pressure reading available.  Will go ahead and increase amlodipine to 5 mg daily.  Follow-up in clinic in 1 month.  I have asked patient to check his blood pressure 1-2 times a day and to bring in his cuff and readings to his next appointment.  2.  Hyperlipidemia- Rosuvastatin previously increased to 20 mg daily.  Rechecking lipid panel today as patient is fasting.  Goal LDL-C less than 100.   Thank you  Olene Floss, Pharm.D,  BCPS, CPP [facility]  [address], [place], [place] [address]  Phone: [phone]; Fax: [phone]

## 2022-07-30 NOTE — Patient Instructions (Signed)
Summary of today's discussion  Increase amlodipine to 5mg  daily  2. Continue irbesartan 300mg  daily  3. Check blood pressure 1-2 times per day  4. Please bring readings and cuff to next appointment  5.   Your blood pressure goal is <130/80  To check your pressure at home you will need to:  1. Sit up in a chair, with feet flat on the floor and back supported. Do not cross your ankles or legs. 2. Rest your left arm so that the cuff is about heart level. If the cuff goes on your upper arm,  then just relax the arm on the table, arm of the chair or your lap. If you have a wrist cuff, we  suggest relaxing your wrist against your chest (think of it as Pledging the Flag with the  wrong arm).  3. Place the cuff snugly around your arm, about 1 inch above the crook of your elbow. The  cords should be inside the groove of your elbow.  4. Sit quietly, with the cuff in place, for about 5 minutes. After that 5 minutes press the power  button to start a reading. 5. Do not talk or move while the reading is taking place.  6. Record your readings on a sheet of paper. Although most cuffs have a memory, it is often  easier to see a pattern developing when the numbers are all in front of you.  7. You can repeat the reading after 1-3 minutes if it is recommended  Make sure your bladder is empty and you have not had caffeine or tobacco within the last 30 min  Always bring your blood pressure log with you to your appointments. If you have not brought your monitor in to be double checked for accuracy, please bring it to your next appointment.  You can find a list of validated (accurate) blood pressure cuffs at [email or web address]   Important lifestyle changes to control high blood pressure  Intervention  Effect on the BP  Lose extra pounds and watch your waistline Weight loss is one of the most effective lifestyle changes for controlling blood pressure. If you're overweight or obese, losing even a small  amount of weight can help reduce blood pressure. Blood pressure might go down by about 1 millimeter of mercury (mm Hg) with each kilogram (about 2.2 pounds) of weight lost.  Exercise regularly As a general goal, aim for at least 30 minutes of moderate physical activity every day. Regular physical activity can lower high blood pressure by about 5 to 8 mm Hg.  Eat a healthy diet Eating a diet rich in whole grains, fruits, vegetables, and low-fat dairy products and low in saturated fat and cholesterol. A healthy diet can lower high blood pressure by up to 11 mm Hg.  Reduce salt (sodium) in your diet Even a small reduction of sodium in the diet can improve heart health and reduce high blood pressure by about 5 to 6 mm Hg.  Limit alcohol One drink equals 12 ounces of beer, 5 ounces of wine, or 1.5 ounces of 80-proof liquor.  Limiting alcohol to less than one drink a day for women or two drinks a day for men can help lower blood pressure by about 4 mm Hg.   Please call me at [phone] with any questions.

## 2022-07-31 ENCOUNTER — Telehealth: Payer: Self-pay | Admitting: Pharmacist

## 2022-07-31 DIAGNOSIS — E785 Hyperlipidemia, unspecified: Secondary | ICD-10-CM

## 2022-07-31 LAB — COMPREHENSIVE METABOLIC PANEL
ALT: 49 IU/L — ABNORMAL HIGH (ref 0–44)
AST: 30 IU/L (ref 0–40)
Albumin/Globulin Ratio: 2 (ref 1.2–2.2)
Albumin: 4.4 g/dL (ref 3.8–4.9)
Alkaline Phosphatase: 60 IU/L (ref 44–121)
BUN/Creatinine Ratio: 24 — ABNORMAL HIGH (ref 9–20)
BUN: 20 mg/dL (ref 6–24)
Bilirubin Total: <0.2 mg/dL (ref 0.0–1.2)
CO2: 24 mmol/L (ref 20–29)
Calcium: 9.7 mg/dL (ref 8.7–10.2)
Chloride: 105 mmol/L (ref 96–106)
Creatinine, Ser: 0.85 mg/dL (ref 0.76–1.27)
Globulin, Total: 2.2 g/dL (ref 1.5–4.5)
Glucose: 95 mg/dL (ref 70–99)
Potassium: 4.5 mmol/L (ref 3.5–5.2)
Sodium: 141 mmol/L (ref 134–144)
Total Protein: 6.6 g/dL (ref 6.0–8.5)
eGFR: 105 mL/min/{1.73_m2} (ref >59)

## 2022-07-31 LAB — LIPID PANEL
Chol/HDL Ratio: 3.3 ratio (ref 0.0–5.0)
Cholesterol, Total: 187 mg/dL (ref 100–199)
HDL: 57 mg/dL (ref >39)
LDL Chol Calc (NIH): 106 mg/dL — ABNORMAL HIGH (ref 0–99)
Triglycerides: 134 mg/dL (ref 0–149)
VLDL Cholesterol Cal: 24 mg/dL (ref 5–40)

## 2022-07-31 NOTE — Telephone Encounter (Addendum)
Lab result discussed with patient over the phone. Suggest to cut down on alcohol and be mindful of saturated fat intake. Patient is aware of follow up LFT in 4-6 weeks.

## 2022-08-05 ENCOUNTER — Encounter (HOSPITAL_BASED_OUTPATIENT_CLINIC_OR_DEPARTMENT_OTHER): Payer: Self-pay | Admitting: Family Medicine

## 2022-08-05 ENCOUNTER — Ambulatory Visit (INDEPENDENT_AMBULATORY_CARE_PROVIDER_SITE_OTHER): Payer: 59 | Admitting: Family Medicine

## 2022-08-05 VITALS — BP 140/94 | HR 71 | Temp 97.7°F | Ht 66.0 in | Wt 191.0 lb

## 2022-08-05 DIAGNOSIS — M25562 Pain in left knee: Secondary | ICD-10-CM

## 2022-08-05 NOTE — Patient Instructions (Signed)
  Medication Instructions:  Your physician recommends that you continue on your current medications as directed. Please refer to the Current Medication list given to you today. --If you need a refill on any your medications before your next appointment, please call your pharmacy first. If no refills are authorized on file call the office.-- Lab Work: Your physician has recommended that you have lab work today: No If you have labs (blood work) drawn today and your tests are completely normal, you will receive your results via MyChart message OR a phone call from our staff.  Please ensure you check your voicemail in the event that you authorized detailed messages to be left on a delegated number. If you have any lab test that is abnormal or we need to change your treatment, we will call you to review the results.  Referrals/Procedures/Imaging: Yes, if needed  Follow-Up: Your next appointment:   Your physician recommends that you schedule a follow-up appointment as needed with Dr. de [place].  You will receive a text message or e-mail with a link to a survey about your care and experience with [place] today! We would greatly appreciate your feedback!   Thanks for letting us be apart of your health journey!!  Primary Care and Sports Medicine   Dr. Ceasar Mons [place]   We encourage you to activate your patient portal called "MyChart".  Sign up information is provided on this After Visit Summary.  MyChart is used to connect with patients for Virtual Visits (Telemedicine).  Patients are able to view lab/test results, encounter notes, upcoming appointments, etc.  Non-urgent messages can be sent to your provider as well. To learn more about what you can do with MyChart, please visit --  [email or web address].

## 2022-08-05 NOTE — Assessment & Plan Note (Signed)
Yesterday, patient noted increase in left knee pain.  Occurred when playing golf.  Does not specifically recall an injury, was walking off green after about 7 holes and noted sharp pain along the inside part of left knee, worse with walking downhill.  Does have history of ACL reconstruction on left knee.  Does not note any increase in swelling, does feel that left knee is always slightly more swollen than right knee following ACL surgery.  Has tried icing, warm water.  Symptoms have subsided some today as compared to yesterday.  Mild symptoms of instability. On exam, patient is in no acute distress. Left knee: No obvious deformity. No effusion.  Negative patellar grind.  Negative crepitus. Full ROM for flexion and extension.  Strength 5 out of 5 for flexion and extension. Anterior drawer: Negative Posterior drawer: Negative Lachman: Negative Varus stress test: Negative Valgus stress test: Positive, mild, good endpoint McMurray's: Negative Thessaly: Positive Neurovascularly intact.  No evidence of lymphatic disease.  Exam suggestive of likely underlying osteoarthritis, possible meniscal tear although without significant symptoms such as notable effusion, mechanical locking.  Discussed general recommendations, feel that proceeding with conservative measures would be reasonable.  Given surgical history, would be appropriate to proceed with initial x-ray imaging.  Could also consider working with physical therapy, home exercise program as per PT.  Following x-ray imaging, steroid injection could also be considered for treatment of likely underlying osteoarthritis, possible small meniscal tear or degenerative meniscal tearing At this time, patient would prefer to proceed with home exercise program and conservative measures for remainder this week and then proceed with x-ray imaging should symptoms persist If x-rays obtained, consider steroid injection, formal PT

## 2022-08-05 NOTE — Progress Notes (Signed)
    Procedures performed today:    None.  Independent interpretation of notes and tests performed by another provider:   None.  Brief History, Exam, Impression, and Recommendations:    BP (!) 140/94 (BP Location: Right Arm, Patient Position: Sitting, Cuff Size: Normal)   Pulse 71   Temp 97.7 F (36.5 C) (Oral)   Ht 5\' 6"  (1.676 m)   Wt 191 lb (86.6 kg)   SpO2 100%   BMI 30.83 kg/m   Left knee pain Yesterday, patient noted increase in left knee pain.  Occurred when playing golf.  Does not specifically recall an injury, was walking off green after about 7 holes and noted sharp pain along the inside part of left knee, worse with walking downhill.  Does have history of ACL reconstruction on left knee.  Does not note any increase in swelling, does feel that left knee is always slightly more swollen than right knee following ACL surgery.  Has tried icing, warm water.  Symptoms have subsided some today as compared to yesterday.  Mild symptoms of instability. On exam, patient is in no acute distress. Left knee: No obvious deformity. No effusion.  Negative patellar grind.  Negative crepitus. Full ROM for flexion and extension.  Strength 5 out of 5 for flexion and extension. Anterior drawer: Negative Posterior drawer: Negative Lachman: Negative Varus stress test: Negative Valgus stress test: Positive, mild, good endpoint McMurray's: Negative Thessaly: Positive Neurovascularly intact.  No evidence of lymphatic disease.  Exam suggestive of likely underlying osteoarthritis, possible meniscal tear although without significant symptoms such as notable effusion, mechanical locking.  Discussed general recommendations, feel that proceeding with conservative measures would be reasonable.  Given surgical history, would be appropriate to proceed with initial x-ray imaging.  Could also consider working with physical therapy, home exercise program as per PT.  Following x-ray imaging, steroid injection  could also be considered for treatment of likely underlying osteoarthritis, possible small meniscal tear or degenerative meniscal tearing At this time, patient would prefer to proceed with home exercise program and conservative measures for remainder this week and then proceed with x-ray imaging should symptoms persist If x-rays obtained, consider steroid injection, formal PT   ___________________________________________ Druanne Bosques de [place], MD, ABFM, CAQSM Primary Care and Sports Medicine [facility]

## 2022-08-25 ENCOUNTER — Ambulatory Visit (INDEPENDENT_AMBULATORY_CARE_PROVIDER_SITE_OTHER): Payer: 59

## 2022-08-25 DIAGNOSIS — M25562 Pain in left knee: Secondary | ICD-10-CM | POA: Diagnosis not present

## 2022-08-27 ENCOUNTER — Ambulatory Visit: Payer: 59

## 2022-09-15 ENCOUNTER — Ambulatory Visit (HOSPITAL_BASED_OUTPATIENT_CLINIC_OR_DEPARTMENT_OTHER): Payer: 59 | Admitting: Family Medicine

## 2022-09-18 ENCOUNTER — Ambulatory Visit: Payer: 59 | Attending: Family Medicine

## 2022-09-18 NOTE — Progress Notes (Deleted)
Patient ID: Curtis Arellano                 DOB: 1970/05/24                      MRN: [id number]     HPI: Curtis Arellano is a 52 y.o. male referred by Dr. Eden Emms to HTN clinic. PMH is significant for HTN, GERD, HLD and anxiety. Patient last saw Dr. Eden Emms on 05/22/21. His blood pressure was 160/98. Losartan 100mg  daily was started. He was encouraged to decrease alcohol intake.   At inital visit in HTN clinic, blood pressure was 130/90. He opted to increase his aerobic exercise and follow up in 1 month.   At visit 07/09/21 his home cuff was found to be inaccurate and he was asked to get a new BP cuff. He sent blood pressure readings 07/07/22. Losartan was stopped and irbesartan 300mg  daily was started. He was also started on amlodipine 2.5mg  daily.  At last visit 5/29 amlodipine was increased to 5mg  daily. LDL-C was 106. ALT 49. Patient encouraged to decrease alcohol.   Alcohol? Home bp? New cuff? Compliance? Exercise Dizziness, lightheadedness, headache, blurred vision, SOB, swelling   Patient presents today for follow-up.  He denies any dizziness, lightheadedness, headache, blurred vision, SOB or swelling.  He only has 1 blood pressure reading of 124/87.  He was at the beach last week and and only checked it once.  He did get a new blood pressure cuff an Omron.  He is still exercising.  Drinking less alcohol than prior.  Tolerating new medications fine.  Only takes diclofenac once a day.  Current HTN meds: irbesartan 300mg  daily, amlodipine 5mg   Previously tried: losartan,  BP goal: <130/80  Family History:  Family History  Problem Relation Age of Onset   Gallbladder disease Mother        removed   Anxiety disorder Mother    Hypertension Father    Hyperlipidemia Father    Stroke Maternal Grandmother    Ulcers Maternal Grandfather        per pt stomach ulcer with bleeding   Colon cancer Neg Hx    Colon polyps Neg Hx    Esophageal cancer Neg Hx    Stomach cancer Neg Hx     Rectal cancer Neg Hx      Social History: 2 shots 3-4 nights per week, no tobacco, no illict drugs  Diet: not addressed today  Exercise: golf, gym 3 times x week for strength training, elipitcal, run/walk 30-3min (3 days a week), pickleball (not as much as before)  Home BP readings: 124/87   Wt Readings from Last 3 Encounters:  08/05/22 191 lb (86.6 kg)  03/18/22 209 lb 6.4 oz (95 kg)  02/06/22 200 lb (90.7 kg)   BP Readings from Last 3 Encounters:  08/05/22 (!) 140/94  07/30/22 130/80  03/18/22 (!) 159/97   Pulse Readings from Last 3 Encounters:  08/05/22 71  07/30/22 69  03/18/22 72    Renal function: CrCl cannot be calculated (Patient's most recent lab result is older than the maximum 21 days allowed.).  Past Medical History:  Diagnosis Date   Anxiety    GERD (gastroesophageal reflux disease)    Incarcerated umbilical hernia 04/2012   Left knee pain    Normal cardiac stress test 2013   Refusal of blood transfusions as patient is Jehovah's Witness 02/21/11    Current Outpatient Medications on File Prior to Visit  Medication Sig Dispense Refill   amLODipine (NORVASC) 5 MG tablet Take 1 tablet (5 mg total) by mouth daily. 90 tablet 3   diclofenac (VOLTAREN) 75 MG EC tablet Take 75 mg by mouth daily.     irbesartan (AVAPRO) 300 MG tablet Take 1 tablet (300 mg total) by mouth daily. 90 tablet 0   rosuvastatin (CRESTOR) 20 MG tablet TAKE 1 TABLET(20 MG) BY MOUTH DAILY 30 tablet 0   No current facility-administered medications on file prior to visit.    Allergies  Allergen Reactions   Penicillins Rash    There were no vitals taken for this visit.   Assessment/Plan:  1. Hypertension - Blood pressure above goal of less than 130/80.  Only 1 home blood pressure reading available.  Will go ahead and increase amlodipine to 5 mg daily.  Follow-up in clinic in 1 month.  I have asked patient to check his blood pressure 1-2 times a day and to bring in his cuff and  readings to his next appointment.  2.  Hyperlipidemia- Rosuvastatin previously increased to 20 mg daily.  Rechecking lipid panel today as patient is fasting.  Goal LDL-C less than 100.   Thank you  Olene Floss, Pharm.D, BCPS, CPP [facility]  [address], [place], [place] [address]  Phone: [phone]; Fax: [phone]

## 2022-09-25 ENCOUNTER — Ambulatory Visit (INDEPENDENT_AMBULATORY_CARE_PROVIDER_SITE_OTHER): Payer: 59 | Admitting: Family Medicine

## 2022-09-25 ENCOUNTER — Encounter (HOSPITAL_BASED_OUTPATIENT_CLINIC_OR_DEPARTMENT_OTHER): Payer: Self-pay | Admitting: Family Medicine

## 2022-09-25 VITALS — BP 132/85 | HR 69 | Ht 66.0 in | Wt 198.8 lb

## 2022-09-25 DIAGNOSIS — M25562 Pain in left knee: Secondary | ICD-10-CM | POA: Diagnosis not present

## 2022-09-25 DIAGNOSIS — I10 Essential (primary) hypertension: Secondary | ICD-10-CM

## 2022-09-25 DIAGNOSIS — Z0000 Encounter for general adult medical examination without abnormal findings: Secondary | ICD-10-CM

## 2022-09-25 MED ORDER — DICLOFENAC SODIUM 75 MG PO TBEC: 75.0000 mg | DELAYED_RELEASE_TABLET | Freq: Every day | ORAL | 1 refills | Status: DC

## 2022-09-25 NOTE — Progress Notes (Signed)
    Procedures performed today:    None.  Independent interpretation of notes and tests performed by another provider:   None.  Brief History, Exam, Impression, and Recommendations:    BP 132/85   Pulse 69   Ht 5\' 6"  (1.676 m)   Wt 198 lb 12.8 oz (90.2 kg)   SpO2 99%   BMI 32.09 kg/m   Primary hypertension Assessment & Plan: Patient continues with amlodipine and irbesartan, taking as instructed, not checking blood pressure regularly at home.  No current issues with chest pain or headaches. Blood pressure borderline controlled in the office today Recommend continuing current medication regimen, no changes at this time Recommend intermittent monitoring of blood pressure at home, DASH diet   Left knee pain, unspecified chronicity Assessment & Plan: Continues to have intermittent knee pain.  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  He does feel that some of his knee/leg pain is related to back issues and is uncertain how much of current knee pain may be related to the knee itself.  We did review x-rays today, degenerative changes are noted indicating some underlying arthritis which certainly could be causing the specific symptoms. We discussed options, he plans to schedule evaluation with spine specialist with consideration for repeat injection.  Pending progress with this injection, he may return to office for further evaluation if the symptoms do persist and if they are felt to be coming from the knee itself If symptoms persist and knee is felt to be source of symptoms, would likely consider proceeding with steroid injection of knee   Wellness examination -     CBC with Differential/Platelet; Future -     Comprehensive metabolic panel; Future -     Hemoglobin A1c; Future -     Lipid panel; Future -     TSH Rfx on Abnormal to Free T4; Future  Other orders -     Diclofenac Sodium; Take 1 tablet (75 mg  total) by mouth daily.  Dispense: 30 tablet; Refill: 1  Return in about 3 months (around 12/26/2022) for CPE with fasting labs 1 week prior.   ___________________________________________ Alena Blankenbeckler de [place], MD, ABFM, CAQSM Primary Care and Sports Medicine [facility]

## 2022-09-25 NOTE — Assessment & Plan Note (Signed)
Continues to have intermittent knee pain.  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  He does feel that some of his knee/leg pain is related to back issues and is uncertain how much of current knee pain may be related to the knee itself.  We did review x-rays today, degenerative changes are noted indicating some underlying arthritis which certainly could be causing the specific symptoms. We discussed options, he plans to schedule evaluation with spine specialist with consideration for repeat injection.  Pending progress with this injection, he may return to office for further evaluation if the symptoms do persist and if they are felt to be coming from the knee itself If symptoms persist and knee is felt to be source of symptoms, would likely consider proceeding with steroid injection of knee

## 2022-09-25 NOTE — Assessment & Plan Note (Signed)
Patient continues with amlodipine and irbesartan, taking as instructed, not checking blood pressure regularly at home.  No current issues with chest pain or headaches. Blood pressure borderline controlled in the office today Recommend continuing current medication regimen, no changes at this time Recommend intermittent monitoring of blood pressure at home, DASH diet

## 2022-10-06 ENCOUNTER — Other Ambulatory Visit: Payer: Self-pay | Admitting: Cardiovascular Disease

## 2022-11-23 ENCOUNTER — Other Ambulatory Visit: Payer: Self-pay | Admitting: Cardiovascular Disease

## 2022-11-25 MED ORDER — IRBESARTAN 300 MG PO TABS: 300.0000 mg | ORAL_TABLET | Freq: Every day | ORAL | 0 refills | Status: DC

## 2022-12-19 ENCOUNTER — Other Ambulatory Visit: Payer: Self-pay | Admitting: Cardiovascular Disease

## 2022-12-23 ENCOUNTER — Other Ambulatory Visit (HOSPITAL_BASED_OUTPATIENT_CLINIC_OR_DEPARTMENT_OTHER): Payer: 59

## 2022-12-30 ENCOUNTER — Encounter (HOSPITAL_BASED_OUTPATIENT_CLINIC_OR_DEPARTMENT_OTHER): Payer: 59 | Admitting: Family Medicine

## 2023-01-05 ENCOUNTER — Other Ambulatory Visit (HOSPITAL_BASED_OUTPATIENT_CLINIC_OR_DEPARTMENT_OTHER): Payer: 59

## 2023-01-06 LAB — HEPATIC FUNCTION PANEL
ALT: 26 [IU]/L (ref 0–44)
AST: 17 [IU]/L (ref 0–40)
Albumin: 4.7 g/dL (ref 3.8–4.9)
Alkaline Phosphatase: 55 [IU]/L (ref 44–121)
Bilirubin Total: 0.4 mg/dL (ref 0.0–1.2)
Bilirubin, Direct: 0.12 mg/dL (ref 0.00–0.40)
Total Protein: 6.6 g/dL (ref 6.0–8.5)

## 2023-01-12 ENCOUNTER — Ambulatory Visit (INDEPENDENT_AMBULATORY_CARE_PROVIDER_SITE_OTHER): Payer: 59 | Admitting: Family Medicine

## 2023-01-12 ENCOUNTER — Encounter (HOSPITAL_BASED_OUTPATIENT_CLINIC_OR_DEPARTMENT_OTHER): Payer: Self-pay | Admitting: Family Medicine

## 2023-01-12 VITALS — BP 136/84 | HR 71 | Ht 66.0 in | Wt 203.0 lb

## 2023-01-12 DIAGNOSIS — Z0000 Encounter for general adult medical examination without abnormal findings: Secondary | ICD-10-CM

## 2023-01-12 DIAGNOSIS — G8929 Other chronic pain: Secondary | ICD-10-CM | POA: Insufficient documentation

## 2023-01-12 DIAGNOSIS — M5416 Radiculopathy, lumbar region: Secondary | ICD-10-CM | POA: Insufficient documentation

## 2023-01-12 NOTE — Assessment & Plan Note (Addendum)
Routine HCM labs ordered. HCM reviewed/discussed. Anticipatory guidance regarding healthy weight, lifestyle and choices given.  Recommend healthy diet.  Recommend approximately 150 minutes/week of moderate intensity exercise Recommend regular dental and vision exams Always use seatbelt/lap and shoulder restraints Recommend using smoke alarms and checking batteries at least twice a year Recommend using sunscreen when outside Discussed colon cancer screening recommendations, options.  Patient is UTD Discussed recommendations for shingles vaccine.  Patient to consider Discussed tetanus immunization recommendations, patient is due, he will return for nurse visit to have this completed the next couple weeks.

## 2023-01-12 NOTE — Progress Notes (Signed)
Subjective:    CC: Annual Physical Exam  HPI:  Curtis Arellano is a 52 y.o. presenting for annual physical  I reviewed the past medical history, family history, social history, surgical history, and allergies today and no changes were needed.  Please see the problem list section below in epic for further details.  Past Medical History: Past Medical History:  Diagnosis Date   Anxiety    GERD (gastroesophageal reflux disease)    Incarcerated umbilical hernia 04/2012   Left knee pain    Normal cardiac stress test 2013   Refusal of blood transfusions as patient is Jehovah's Witness 02/21/11   Past Surgical History: Past Surgical History:  Procedure Laterality Date   ANTERIOR CRUCIATE LIGAMENT REPAIR  ~ 1998   left knee   EYE SURGERY  ~ 2004   "growth on right eye cut off"   FRACTURE SURGERY  ~ 1992   broken nose   HERNIA REPAIR     INSERTION OF MESH N/A 04/27/2013   Procedure: INSERTION OF MESH;  Surgeon: Ernestene Mention, MD;  Location: [facility] OR;  Service: General;  Laterality: N/A;   NASAL SEPTUM SURGERY  ~ 1990   UMBILICAL HERNIA REPAIR N/A 04/27/2013   Procedure: HERNIA REPAIR UMBILICAL ADULT;  Surgeon: Ernestene Mention, MD;  Location: [facility] OR;  Service: General;  Laterality: N/A;   UPPER GASTROINTESTINAL ENDOSCOPY  2013   Dr.Mann   VASECTOMY     Social History: Social History   Socioeconomic History   Marital status: Married    Spouse name: Not on file   Number of children: Not on file   Years of education: Not on file   Highest education level: Not on file  Occupational History   Occupation: IT specialist    Employer: [employer]   Tobacco Use   Smoking status: Never    Passive exposure: Never   Smokeless tobacco: Never  Vaping Use   Vaping status: Never Used  Substance and Sexual Activity   Alcohol use: Yes    Alcohol/week: 5.0 - 6.0 standard drinks of alcohol    Types: 5 - 6 Standard drinks or equivalent per week   Drug use: No   Sexual activity:  Yes    Birth control/protection: Surgical    Comment: married  Other Topics Concern   Not on file  Social History Narrative   Per blue health survey form - pt does not exercise   Social Determinants of Health   Financial Resource Strain: Not on file  Food Insecurity: Not on file  Transportation Needs: Not on file  Physical Activity: Not on file  Stress: Not on file  Social Connections: Not on file   Family History: Family History  Problem Relation Age of Onset   Gallbladder disease Mother        removed   Anxiety disorder Mother    Hypertension Father    Hyperlipidemia Father    Stroke Maternal Grandmother    Ulcers Maternal Grandfather        per pt stomach ulcer with bleeding   Colon cancer Neg Hx    Colon polyps Neg Hx    Esophageal cancer Neg Hx    Stomach cancer Neg Hx    Rectal cancer Neg Hx    Allergies: Allergies  Allergen Reactions   Penicillins Rash   Medications: See med rec.  Review of Systems: No headache, visual changes, nausea, vomiting, diarrhea, constipation, dizziness, abdominal pain, skin rash, fevers, chills, night sweats, swollen  lymph nodes, weight loss, chest pain, body aches, joint swelling, muscle aches, shortness of breath, mood changes, visual or auditory hallucinations.  Objective:    BP 136/84 (BP Location: Left Arm, Patient Position: Sitting, Cuff Size: Normal)   Pulse 71   Ht 5\' 6"  (1.676 m)   Wt 203 lb (92.1 kg)   SpO2 96%   BMI 32.77 kg/m   General: Well Developed, well nourished, and in no acute distress. Neuro: Alert and oriented x3, extra-ocular muscles intact, sensation grossly intact. Cranial nerves II through XII are intact, motor, sensory, and coordinative functions are all intact. HEENT: Normocephalic, atraumatic, pupils equal round reactive to light, neck supple, no masses, no lymphadenopathy, thyroid nonpalpable. Oropharynx, nasopharynx, external ear canals are unremarkable. Skin: Warm and dry, no rashes  noted. Cardiac: Regular rate and rhythm, no murmurs rubs or gallops. Respiratory: Clear to auscultation bilaterally. Not using accessory muscles, speaking in full sentences. Abdominal: Soft, nontender, nondistended, positive bowel sounds, no masses, no organomegaly. Musculoskeletal: Shoulder, elbow, wrist, hip, knee, ankle stable, and with full range of motion.  Impression and Recommendations:    Wellness examination Assessment & Plan: Routine HCM labs ordered. HCM reviewed/discussed. Anticipatory guidance regarding healthy weight, lifestyle and choices given.  Recommend healthy diet.  Recommend approximately 150 minutes/week of moderate intensity exercise Recommend regular dental and vision exams Always use seatbelt/lap and shoulder restraints Recommend using smoke alarms and checking batteries at least twice a year Recommend using sunscreen when outside Discussed colon cancer screening recommendations, options.  Patient is UTD Discussed recommendations for shingles vaccine.  Patient to consider Discussed tetanus immunization recommendations, patient is due, he will return for nurse visit to have this completed the next couple weeks.  Orders: -     TSH Rfx on Abnormal to Free T4 -     Lipid panel -     Hemoglobin A1c -     Comprehensive metabolic panel -     CBC with Differential/Platelet  Return in about 4 months (around 05/12/2023) for hypertension.  Patient will need to return to have labs drawn.  He will also plan to return to have updated tetanus completed   ___________________________________________ Diyan Dave de [place], MD, ABFM, [facility] Primary Care and Sports Medicine [facility]

## 2023-01-26 ENCOUNTER — Other Ambulatory Visit (HOSPITAL_BASED_OUTPATIENT_CLINIC_OR_DEPARTMENT_OTHER): Payer: 59

## 2023-01-26 ENCOUNTER — Other Ambulatory Visit (HOSPITAL_BASED_OUTPATIENT_CLINIC_OR_DEPARTMENT_OTHER): Payer: Self-pay | Admitting: Family Medicine

## 2023-01-26 ENCOUNTER — Ambulatory Visit (INDEPENDENT_AMBULATORY_CARE_PROVIDER_SITE_OTHER): Payer: 59

## 2023-01-26 DIAGNOSIS — Z23 Encounter for immunization: Secondary | ICD-10-CM

## 2023-01-26 NOTE — Progress Notes (Signed)
Patient presents in office today for tetanus vaccine. Vaccine tolerated well.

## 2023-01-27 ENCOUNTER — Other Ambulatory Visit: Payer: Self-pay | Admitting: Cardiovascular Disease

## 2023-01-27 LAB — COMPREHENSIVE METABOLIC PANEL
ALT: 31 [IU]/L (ref 0–44)
AST: 19 [IU]/L (ref 0–40)
Albumin: 4.4 g/dL (ref 3.8–4.9)
Alkaline Phosphatase: 54 [IU]/L (ref 44–121)
BUN/Creatinine Ratio: 20 (ref 9–20)
BUN: 16 mg/dL (ref 6–24)
Bilirubin Total: <0.2 mg/dL (ref 0.0–1.2)
CO2: 22 mmol/L (ref 20–29)
Calcium: 9.4 mg/dL (ref 8.7–10.2)
Chloride: 105 mmol/L (ref 96–106)
Creatinine, Ser: 0.82 mg/dL (ref 0.76–1.27)
Globulin, Total: 2 g/dL (ref 1.5–4.5)
Glucose: 87 mg/dL (ref 70–99)
Potassium: 4.2 mmol/L (ref 3.5–5.2)
Sodium: 142 mmol/L (ref 134–144)
Total Protein: 6.4 g/dL (ref 6.0–8.5)
eGFR: 106 mL/min/{1.73_m2} (ref >59)

## 2023-01-27 LAB — CBC WITH DIFFERENTIAL/PLATELET
Basophils Absolute: 0 10*3/uL (ref 0.0–0.2)
Basos: 1 %
EOS (ABSOLUTE): 0.1 10*3/uL (ref 0.0–0.4)
Eos: 3 %
Hematocrit: 42.3 % (ref 37.5–51.0)
Hemoglobin: 14.1 g/dL (ref 13.0–17.7)
Immature Grans (Abs): 0 10*3/uL (ref 0.0–0.1)
Immature Granulocytes: 0 %
Lymphocytes Absolute: 2 10*3/uL (ref 0.7–3.1)
Lymphs: 41 %
MCH: 32.3 pg (ref 26.6–33.0)
MCHC: 33.3 g/dL (ref 31.5–35.7)
MCV: 97 fL (ref 79–97)
Monocytes Absolute: 0.4 10*3/uL (ref 0.1–0.9)
Monocytes: 8 %
Neutrophils Absolute: 2.4 10*3/uL (ref 1.4–7.0)
Neutrophils: 47 %
Platelets: 220 10*3/uL (ref 150–450)
RBC: 4.37 x10E6/uL (ref 4.14–5.80)
RDW: 13.5 % (ref 11.6–15.4)
WBC: 5 10*3/uL (ref 3.4–10.8)

## 2023-01-27 LAB — LIPID PANEL
Chol/HDL Ratio: 4.3 {ratio} (ref 0.0–5.0)
Cholesterol, Total: 255 mg/dL — ABNORMAL HIGH (ref 100–199)
HDL: 59 mg/dL (ref >39)
LDL Chol Calc (NIH): 156 mg/dL — ABNORMAL HIGH (ref 0–99)
Triglycerides: 219 mg/dL — ABNORMAL HIGH (ref 0–149)
VLDL Cholesterol Cal: 40 mg/dL (ref 5–40)

## 2023-01-27 LAB — TSH RFX ON ABNORMAL TO FREE T4: TSH: 3.26 u[IU]/mL (ref 0.450–4.500)

## 2023-01-27 LAB — HEMOGLOBIN A1C
Est. average glucose Bld gHb Est-mCnc: 111 mg/dL
Hgb A1c MFr Bld: 5.5 % (ref 4.8–5.6)

## 2023-01-28 MED ORDER — ROSUVASTATIN CALCIUM 20 MG PO TABS: 20.0000 mg | ORAL_TABLET | Freq: Every day | ORAL | 5 refills | Status: DC

## 2023-02-02 ENCOUNTER — Encounter (HOSPITAL_BASED_OUTPATIENT_CLINIC_OR_DEPARTMENT_OTHER): Payer: Self-pay | Admitting: Family Medicine

## 2023-02-27 ENCOUNTER — Other Ambulatory Visit: Payer: Self-pay | Admitting: Cardiovascular Disease

## 2023-03-14 ENCOUNTER — Other Ambulatory Visit: Payer: Self-pay | Admitting: Cardiovascular Disease

## 2023-03-23 ENCOUNTER — Encounter (HOSPITAL_BASED_OUTPATIENT_CLINIC_OR_DEPARTMENT_OTHER): Payer: Self-pay | Admitting: Family Medicine

## 2023-03-23 NOTE — Telephone Encounter (Signed)
Pt of Dr. Tommi Rumps Cuba's. Jon Gills, please see mychart sent by pt and advise.

## 2023-03-26 ENCOUNTER — Ambulatory Visit (HOSPITAL_BASED_OUTPATIENT_CLINIC_OR_DEPARTMENT_OTHER): Payer: 59 | Admitting: Student

## 2023-03-26 ENCOUNTER — Ambulatory Visit (HOSPITAL_BASED_OUTPATIENT_CLINIC_OR_DEPARTMENT_OTHER): Payer: 59

## 2023-03-26 ENCOUNTER — Encounter (HOSPITAL_BASED_OUTPATIENT_CLINIC_OR_DEPARTMENT_OTHER): Payer: Self-pay | Admitting: Student

## 2023-03-26 DIAGNOSIS — M25562 Pain in left knee: Secondary | ICD-10-CM

## 2023-03-26 DIAGNOSIS — G8929 Other chronic pain: Secondary | ICD-10-CM

## 2023-03-26 NOTE — Progress Notes (Signed)
Chief Complaint: Left knee pain     History of Present Illness:    Curtis Arellano is a 53 y.o. male presenting to clinic today for evaluation of left knee pain.  He does have a history of an ACL reconstruction in 1998 due to a basketball injury.  Approximately 1 month ago he was playing pickle ball and immediately after planting on the left leg he felt pain in the medial knee.  He was able to finish the game however this continued to be painful.  Pain has continued and he rates this as moderate to severe.  Pain is exacerbated with flexion and valgus motion such as during his golf swing.  Denies any other episodes of buckling, catching, or locking.  He does have frequent popping sensation within the knee.  Works in Consulting civil engineer.   Surgical History:   ACL reconstruction 1998  PMH/PSH/Family History/Social History/Meds/Allergies:    Past Medical History:  Diagnosis Date   Anxiety    GERD (gastroesophageal reflux disease)    Incarcerated umbilical hernia 04/2012   Left knee pain    Normal cardiac stress test 2013   Refusal of blood transfusions as patient is Jehovah's Witness 02/21/11   Past Surgical History:  Procedure Laterality Date   ANTERIOR CRUCIATE LIGAMENT REPAIR  ~ 1998   left knee   EYE SURGERY  ~ 2004   "growth on right eye cut off"   FRACTURE SURGERY  ~ 1992   broken nose   HERNIA REPAIR     INSERTION OF MESH N/A 04/27/2013   Procedure: INSERTION OF MESH;  Surgeon: Ernestene Mention, MD;  Location: [facility] OR;  Service: General;  Laterality: N/A;   NASAL SEPTUM SURGERY  ~ 1990   UMBILICAL HERNIA REPAIR N/A 04/27/2013   Procedure: HERNIA REPAIR UMBILICAL ADULT;  Surgeon: Ernestene Mention, MD;  Location: [facility] OR;  Service: General;  Laterality: N/A;   UPPER GASTROINTESTINAL ENDOSCOPY  2013   Dr.Mann   VASECTOMY     Social History   Socioeconomic History   Marital status: Married    Spouse name: Not on file   Number of children: Not on file    Years of education: Not on file   Highest education level: Not on file  Occupational History   Occupation: IT specialist    Employer: [employer]   Tobacco Use   Smoking status: Never    Passive exposure: Never   Smokeless tobacco: Never  Vaping Use   Vaping status: Never Used  Substance and Sexual Activity   Alcohol use: Yes    Alcohol/week: 5.0 - 6.0 standard drinks of alcohol    Types: 5 - 6 Standard drinks or equivalent per week   Drug use: No   Sexual activity: Yes    Birth control/protection: Surgical    Comment: married  Other Topics Concern   Not on file  Social History Narrative   Per blue health survey form - pt does not exercise   Social Drivers of Corporate investment banker Strain: Not on file  Food Insecurity: Not on file  Transportation Needs: Not on file  Physical Activity: Not on file  Stress: Not on file  Social Connections: Not on file   Family History  Problem Relation Age of Onset   Gallbladder disease Mother  removed   Anxiety disorder Mother    Hypertension Father    Hyperlipidemia Father    Stroke Maternal Grandmother    Ulcers Maternal Grandfather        per pt stomach ulcer with bleeding   Colon cancer Neg Hx    Colon polyps Neg Hx    Esophageal cancer Neg Hx    Stomach cancer Neg Hx    Rectal cancer Neg Hx    Allergies  Allergen Reactions   Penicillins Rash   Current Outpatient Medications  Medication Sig Dispense Refill   amLODipine (NORVASC) 5 MG tablet Take 1 tablet (5 mg total) by mouth daily. 90 tablet 3   diclofenac (VOLTAREN) 75 MG EC tablet Take 1 tablet (75 mg total) by mouth daily. 30 tablet 1   irbesartan (AVAPRO) 300 MG tablet TAKE 1 TABLET(300 MG) BY MOUTH DAILY 90 tablet 1   rosuvastatin (CRESTOR) 20 MG tablet Take 1 tablet (20 mg total) by mouth daily. 30 tablet 5   No current facility-administered medications for this visit.   No results found.  Review of Systems:   A ROS was performed including  pertinent positives and negatives as documented in the HPI.  Physical Exam :   Constitutional: NAD and appears stated age Neurological: Alert and oriented Psych: Appropriate affect and cooperative There were no vitals taken for this visit.   Comprehensive Musculoskeletal Exam:    Left knee exam demonstrates active range of motion from 5 to 130 degrees.  Mild effusion present.  Tenderness with palpation over the medial joint line.  No instability with varus or valgus stress.  Positive McMurray.  Negative Lachman, anterior drawer, and posterior drawer.  Imaging:   Xray (left knee 4 views): Prior ACL reconstruction.  There is chondrocalcinosis noted in both the medial and lateral joint spaces which was seen on prior radiographs.  Mild tricompartmental degenerative changes with small patellofemoral osteophytes and a notable superior patella enthesophyte.   I personally reviewed and interpreted the radiographs.   Assessment:   53 y.o. male with history of ACL reconstruction over 25 years ago presenting with persistent medial left knee pain after a pickleball injury 1 month ago.  He did appear to have a similar episode 6 months ago which was treated conservatively with home exercise program.  His ACL does feel intact on today's exam however I do have some concern for an underlying medial meniscus tear.  Given his history I have recommended proceeding with an MRI to rule out a meniscal or ligamentous injury before determining treatment plan.  If MRI appears negative he may be a good candidate for a steroid injection as he does have some mild osteoarthritis and/or physical therapy for activity specific rehabilitation.  Will plan to follow-up shortly after MRI.  Plan :    -Obtain MRI of the left knee return to clinic for review and treatment discussion     I personally saw and evaluated the patient, and participated in the management and treatment plan.  Hazle Nordmann, PA-C Orthopedics

## 2023-03-30 ENCOUNTER — Other Ambulatory Visit (HOSPITAL_BASED_OUTPATIENT_CLINIC_OR_DEPARTMENT_OTHER): Payer: Self-pay | Admitting: Family Medicine

## 2023-03-31 ENCOUNTER — Telehealth (HOSPITAL_BASED_OUTPATIENT_CLINIC_OR_DEPARTMENT_OTHER): Payer: Self-pay | Admitting: *Deleted

## 2023-03-31 NOTE — Telephone Encounter (Signed)
Copied from CRM [id number]. Topic: Referral - Question >> Mar 31, 2023 12:45 PM Fonda Kinder J wrote: Reason for CRM: [facility] called in stating that they need an updated order for MRI on pt's right knee, she states the order that they have has not been signed by th provider and they need a signature and for it be faxed to [phone]

## 2023-03-31 NOTE — Telephone Encounter (Signed)
This was ordered by orthopedic not primary care

## 2023-04-01 ENCOUNTER — Telehealth (HOSPITAL_BASED_OUTPATIENT_CLINIC_OR_DEPARTMENT_OTHER): Payer: Self-pay | Admitting: Student

## 2023-04-01 NOTE — Telephone Encounter (Signed)
Curtis Arellano form [facility] [phone]) (p) [phone]. States that she does not have the page with [employer]

## 2023-04-02 ENCOUNTER — Other Ambulatory Visit (HOSPITAL_BASED_OUTPATIENT_CLINIC_OR_DEPARTMENT_OTHER): Payer: Self-pay

## 2023-04-02 ENCOUNTER — Encounter (HOSPITAL_BASED_OUTPATIENT_CLINIC_OR_DEPARTMENT_OTHER): Payer: Self-pay

## 2023-04-17 ENCOUNTER — Encounter (HOSPITAL_BASED_OUTPATIENT_CLINIC_OR_DEPARTMENT_OTHER): Payer: Self-pay | Admitting: Student

## 2023-04-17 ENCOUNTER — Ambulatory Visit (HOSPITAL_BASED_OUTPATIENT_CLINIC_OR_DEPARTMENT_OTHER): Payer: 59 | Admitting: Student

## 2023-04-17 DIAGNOSIS — G8929 Other chronic pain: Secondary | ICD-10-CM | POA: Diagnosis not present

## 2023-04-17 DIAGNOSIS — M25562 Pain in left knee: Secondary | ICD-10-CM | POA: Diagnosis not present

## 2023-04-17 NOTE — Progress Notes (Signed)
Chief Complaint: Left knee pain     History of Present Illness:   04/17/23: Patient is here today for follow-up and MRI review of his right knee.  Overall he reports that his pain has improved significantly and rates this at a 1/10 today.  He has been able to play a few rounds of golf without any significant issues.  Pain is very intermittent which presents on the medial aspect of the knee.   Curtis Arellano is a 53 y.o. male presenting to clinic today for evaluation of left knee pain.  He does have a history of an ACL reconstruction in 1998 due to a basketball injury.  Approximately 1 month ago he was playing pickle ball and immediately after planting on the left leg he felt pain in the medial knee.  He was able to finish the game however this continued to be painful.  Pain has continued and he rates this as moderate to severe.  Pain is exacerbated with flexion and valgus motion such as during his golf swing.  Denies any other episodes of buckling, catching, or locking.  He does have frequent popping sensation within the knee.  Works in Consulting civil engineer.   Surgical History:   ACL reconstruction 1998  PMH/PSH/Family History/Social History/Meds/Allergies:    Past Medical History:  Diagnosis Date   Anxiety    GERD (gastroesophageal reflux disease)    Incarcerated umbilical hernia 04/2012   Left knee pain    Normal cardiac stress test 2013   Refusal of blood transfusions as patient is Jehovah's Witness 02/21/11   Past Surgical History:  Procedure Laterality Date   ANTERIOR CRUCIATE LIGAMENT REPAIR  ~ 1998   left knee   EYE SURGERY  ~ 2004   "growth on right eye cut off"   FRACTURE SURGERY  ~ 1992   broken nose   HERNIA REPAIR     INSERTION OF MESH N/A 04/27/2013   Procedure: INSERTION OF MESH;  Surgeon: Ernestene Mention, MD;  Location: [facility] OR;  Service: General;  Laterality: N/A;   NASAL SEPTUM SURGERY  ~ 1990   UMBILICAL HERNIA REPAIR N/A 04/27/2013    Procedure: HERNIA REPAIR UMBILICAL ADULT;  Surgeon: Ernestene Mention, MD;  Location: [facility] OR;  Service: General;  Laterality: N/A;   UPPER GASTROINTESTINAL ENDOSCOPY  2013   Dr.Mann   VASECTOMY     Social History   Socioeconomic History   Marital status: Married    Spouse name: Not on file   Number of children: Not on file   Years of education: Not on file   Highest education level: Not on file  Occupational History   Occupation: IT specialist    Employer: [employer]   Tobacco Use   Smoking status: Never    Passive exposure: Never   Smokeless tobacco: Never  Vaping Use   Vaping status: Never Used  Substance and Sexual Activity   Alcohol use: Yes    Alcohol/week: 5.0 - 6.0 standard drinks of alcohol    Types: 5 - 6 Standard drinks or equivalent per week   Drug use: No   Sexual activity: Yes    Birth control/protection: Surgical    Comment: married  Other Topics Concern   Not on file  Social History Narrative   Per blue health survey form - pt  does not exercise   Social Drivers of Corporate investment banker Strain: Not on file  Food Insecurity: Not on file  Transportation Needs: Not on file  Physical Activity: Not on file  Stress: Not on file  Social Connections: Not on file   Family History  Problem Relation Age of Onset   Gallbladder disease Mother        removed   Anxiety disorder Mother    Hypertension Father    Hyperlipidemia Father    Stroke Maternal Grandmother    Ulcers Maternal Grandfather        per pt stomach ulcer with bleeding   Colon cancer Neg Hx    Colon polyps Neg Hx    Esophageal cancer Neg Hx    Stomach cancer Neg Hx    Rectal cancer Neg Hx    Allergies  Allergen Reactions   Penicillins Rash   Current Outpatient Medications  Medication Sig Dispense Refill   amLODipine (NORVASC) 5 MG tablet Take 1 tablet (5 mg total) by mouth daily. 90 tablet 3   diclofenac (VOLTAREN) 75 MG EC tablet TAKE 1 TABLET(75 MG) BY MOUTH DAILY 30 tablet 1    irbesartan (AVAPRO) 300 MG tablet TAKE 1 TABLET(300 MG) BY MOUTH DAILY 90 tablet 1   rosuvastatin (CRESTOR) 20 MG tablet Take 1 tablet (20 mg total) by mouth daily. 30 tablet 5   No current facility-administered medications for this visit.   No results found.  Review of Systems:   A ROS was performed including pertinent positives and negatives as documented in the HPI.  Physical Exam :   Constitutional: NAD and appears stated age Neurological: Alert and oriented Psych: Appropriate affect and cooperative There were no vitals taken for this visit.   Comprehensive Musculoskeletal Exam:    Left knee exam demonstrates active range of motion from 0 to 130 degrees.  Mild effusion present.  Tenderness with palpation over the medial joint line.  No instability with varus or valgus stress.   Imaging:   MRI left knee: Medial meniscus tear involving the posterior horn.  Chondral loss most notable within the medial and patellofemoral compartments.  Prior ACL reconstruction that appears intact.    I personally reviewed and interpreted the radiographs.   Assessment:   53 y.o. male with medial based left knee pain.  Review of MRI today does show presence of a medial meniscus tear as well as mild to moderate chondral loss.  He is very active and overall since the injury his symptoms have improved significantly.  Discussed that he would likely be a good candidate for a cortisone injection for symptom relief should this be bothersome before then considering if any surgical intervention may be necessary.  Patient is in agreement to this plan and will continue to monitor for persistent symptoms and return as needed.  Plan :    -Return to clinic as needed     I personally saw and evaluated the patient, and participated in the management and treatment plan.  Hazle Nordmann, PA-C Orthopedics

## 2023-05-12 ENCOUNTER — Ambulatory Visit (HOSPITAL_BASED_OUTPATIENT_CLINIC_OR_DEPARTMENT_OTHER): Payer: 59 | Admitting: Family Medicine

## 2023-05-27 ENCOUNTER — Other Ambulatory Visit (HOSPITAL_BASED_OUTPATIENT_CLINIC_OR_DEPARTMENT_OTHER): Payer: Self-pay | Admitting: Family Medicine

## 2023-06-24 ENCOUNTER — Encounter (HOSPITAL_BASED_OUTPATIENT_CLINIC_OR_DEPARTMENT_OTHER): Payer: Self-pay | Admitting: Family Medicine

## 2023-06-24 ENCOUNTER — Ambulatory Visit (INDEPENDENT_AMBULATORY_CARE_PROVIDER_SITE_OTHER): Admitting: Family Medicine

## 2023-06-24 VITALS — BP 133/86 | HR 64 | Ht 66.0 in | Wt 188.3 lb

## 2023-06-24 DIAGNOSIS — M25522 Pain in left elbow: Secondary | ICD-10-CM | POA: Diagnosis not present

## 2023-06-24 DIAGNOSIS — I10 Essential (primary) hypertension: Secondary | ICD-10-CM

## 2023-06-24 DIAGNOSIS — M25562 Pain in left knee: Secondary | ICD-10-CM | POA: Diagnosis not present

## 2023-06-24 DIAGNOSIS — Z0000 Encounter for general adult medical examination without abnormal findings: Secondary | ICD-10-CM

## 2023-06-24 MED ORDER — DICLOFENAC SODIUM 75 MG PO TBEC: 75.0000 mg | DELAYED_RELEASE_TABLET | Freq: Two times a day (BID) | ORAL | 1 refills | Status: AC | PRN

## 2023-06-24 NOTE — Assessment & Plan Note (Signed)
 Patient reports that he was in the gym recently and was doing pull-ups when he felt pain along medial aspect of left elbow.  He then also had some pain with curls.  Has not noticed any bruising.  Does have some soreness and discomfort at the site if he fully extends his elbow.  Denies any significant weakness. Patient with no obvious bruising or skin changes.  No obvious defect.  Normal range of motion at elbow, wrist, all digits. Discussed considerations.  Given recent injury, would be reasonable to proceed with conservative measures, relative rest, gradual return to normal activities as tolerated.  If however having continued symptoms for incomplete resolution over the coming weeks, could consider referral to physical therapy or additional testing as needed.

## 2023-06-24 NOTE — Patient Instructions (Signed)
  Medication Instructions:  Your physician recommends that you continue on your current medications as directed. Please refer to the Current Medication list given to you today. --If you need a refill on any your medications before your next appointment, please call your pharmacy first. If no refills are authorized on file call the office.-- Lab Work: Your physician has recommended that you have lab work today: 1 week prior  If you have labs (blood work) drawn today and your tests are completely normal, you will receive your results via MyChart message OR a phone call from our staff.  Please ensure you check your voicemail in the event that you authorized detailed messages to be left on a delegated number. If you have any lab test that is abnormal or we need to change your treatment, we will call you to review the results.   Follow-Up: Your next appointment:   Your physician recommends that you schedule a follow-up appointment in: 7 mth Physical with Dr. de Peru  You will receive a text message or e-mail with a link to a survey about your care and experience with us  today! We would greatly appreciate your feedback!   Thanks for letting us  be apart of your health journey!!  Primary Care and Sports Medicine   Dr. Court Distance Peru   We encourage you to activate your patient portal called "MyChart".  Sign up information is provided on this After Visit Summary.  MyChart is used to connect with patients for Virtual Visits (Telemedicine).  Patients are able to view lab/test results, encounter notes, upcoming appointments, etc.  Non-urgent messages can be sent to your provider as well. To learn more about what you can do with MyChart, please visit --  [email or web address].

## 2023-06-24 NOTE — Assessment & Plan Note (Signed)
 Patient did have evaluation with orthopedist downstairs and ultimately did have MRI completed.  MRI did show underlying arthritis as well as meniscal tear of posterior horn of medial meniscus.  Orthopedist did discuss treatment considerations.  At time of last appointment with them, patient was not having significant symptoms.  Ultimately, consideration can be given for possible steroid injection.  Does indicate having slight increase in symptoms currently related to golfing over the weekend. No further invention at this time.  Pending progress with any symptoms, can return to office here or with orthopedist for further evaluation and consideration of steroid injection

## 2023-06-24 NOTE — Assessment & Plan Note (Signed)
 Patient continues with amlodipine  and irbesartan , taking as instructed, not checking blood pressure regularly at home.  No current issues with chest pain or headaches. Blood pressure borderline controlled in the office today, similar to last appointment Recommend continuing current medication regimen, no changes at this time Recommend intermittent monitoring of blood pressure at home, DASH diet He will send message with home readings

## 2023-06-24 NOTE — Progress Notes (Signed)
    Procedures performed today:    None.  Independent interpretation of notes and tests performed by another provider:   None.  Brief History, Exam, Impression, and Recommendations:    BP 133/86 (BP Location: Left Arm, Patient Position: Sitting, Cuff Size: Large)   Pulse 64   Ht 5\' 6"  (1.676 m)   Wt 188 lb 4.8 oz (85.4 kg)   SpO2 94%   BMI 30.39 kg/m   Primary hypertension Assessment & Plan: Patient continues with amlodipine  and irbesartan , taking as instructed, not checking blood pressure regularly at home.  No current issues with chest pain or headaches. Blood pressure borderline controlled in the office today, similar to last appointment Recommend continuing current medication regimen, no changes at this time Recommend intermittent monitoring of blood pressure at home, DASH diet He will send message with home readings   Left knee pain, unspecified chronicity Assessment & Plan: Patient did have evaluation with orthopedist downstairs and ultimately did have MRI completed.  MRI did show underlying arthritis as well as meniscal tear of posterior horn of medial meniscus.  Orthopedist did discuss treatment considerations.  At time of last appointment with them, patient was not having significant symptoms.  Ultimately, consideration can be given for possible steroid injection.  Does indicate having slight increase in symptoms currently related to golfing over the weekend. No further invention at this time.  Pending progress with any symptoms, can return to office here or with orthopedist for further evaluation and consideration of steroid injection   Left elbow pain Assessment & Plan: Patient reports that he was in the gym recently and was doing pull-ups when he felt pain along medial aspect of left elbow.  He then also had some pain with curls.  Has not noticed any bruising.  Does have some soreness and discomfort at the site if he fully extends his elbow.  Denies any significant  weakness. Patient with no obvious bruising or skin changes.  No obvious defect.  Normal range of motion at elbow, wrist, all digits. Discussed considerations.  Given recent injury, would be reasonable to proceed with conservative measures, relative rest, gradual return to normal activities as tolerated.  If however having continued symptoms for incomplete resolution over the coming weeks, could consider referral to physical therapy or additional testing as needed.   Wellness examination -     CBC with Differential/Platelet; Future -     Comprehensive metabolic panel with GFR; Future -     Hemoglobin A1c; Future -     Lipid panel; Future -     TSH Rfx on Abnormal to Free T4; Future  Other orders -     Diclofenac  Sodium; Take 1 tablet (75 mg total) by mouth 2 (two) times daily as needed.  Dispense: 30 tablet; Refill: 1  Return in about 7 months (around 01/24/2024) for CPE with fasting labs 1 week prior.   ___________________________________________ Zamire Whitehurst de [place], MD, ABFM, CAQSM Primary Care and Sports Medicine [facility]

## 2023-07-13 ENCOUNTER — Other Ambulatory Visit: Payer: Self-pay | Admitting: Cardiovascular Disease

## 2023-08-31 ENCOUNTER — Encounter (HOSPITAL_BASED_OUTPATIENT_CLINIC_OR_DEPARTMENT_OTHER): Payer: Self-pay | Admitting: Family Medicine

## 2023-09-01 ENCOUNTER — Other Ambulatory Visit: Payer: Self-pay | Admitting: Pharmacist

## 2023-09-09 ENCOUNTER — Telehealth (HOSPITAL_BASED_OUTPATIENT_CLINIC_OR_DEPARTMENT_OTHER): Payer: Self-pay | Admitting: Family Medicine

## 2023-09-09 ENCOUNTER — Encounter (HOSPITAL_BASED_OUTPATIENT_CLINIC_OR_DEPARTMENT_OTHER): Payer: Self-pay

## 2023-09-09 NOTE — Telephone Encounter (Signed)
 Lvm and mychart message about new appointment

## 2023-10-02 NOTE — Telephone Encounter (Signed)
 Please see mychart message sent by pt and advise.

## 2023-10-08 ENCOUNTER — Other Ambulatory Visit: Payer: Self-pay | Admitting: Cardiovascular Disease

## 2023-10-14 ENCOUNTER — Encounter (HOSPITAL_BASED_OUTPATIENT_CLINIC_OR_DEPARTMENT_OTHER): Payer: Self-pay

## 2023-10-19 ENCOUNTER — Encounter (HOSPITAL_BASED_OUTPATIENT_CLINIC_OR_DEPARTMENT_OTHER): Payer: Self-pay | Admitting: Family Medicine

## 2023-10-20 ENCOUNTER — Encounter (HOSPITAL_BASED_OUTPATIENT_CLINIC_OR_DEPARTMENT_OTHER): Payer: Self-pay | Admitting: Family Medicine

## 2023-10-20 ENCOUNTER — Ambulatory Visit (INDEPENDENT_AMBULATORY_CARE_PROVIDER_SITE_OTHER): Admitting: Family Medicine

## 2023-10-20 VITALS — BP 134/90 | HR 68 | Ht 65.0 in | Wt 182.4 lb

## 2023-10-20 DIAGNOSIS — M25562 Pain in left knee: Secondary | ICD-10-CM | POA: Diagnosis not present

## 2023-10-20 NOTE — Assessment & Plan Note (Signed)
 Patient with history of left knee pain and prior ACL reconstruction, did have new injury.  He reports that about a week ago, he was playing pickle ball, went for a backhand that shot and felt a twinge in his left knee, primarily over anteromedial aspect of joint.  Saw playing for about 3 to 4 minutes and then was able to resume, continue playing for at least an hour after that without any issue.  He notes that upon waking the next day, he had notable increase in pain, reports that pain at that time was about 8 out of 10.  He did not have any swelling.  Pain was primarily along medial aspect of the knee.  He did rest the knee, used topical treatment including icing.  Over the coming days, he did gradually return to activities, did golf over the weekend with mild discomfort but without significant restrictions.  Rates pain currently at about a 2-3 out of 10.  He did have MRI completed previously which did show evidence of meniscal tear, degenerative changes, chondral loss. On exam, Left knee: No obvious deformity. No effusion.  Negative patellar grind.  Negative crepitus. Full ROM for flexion and extension.  Strength 5 out of 5 for flexion and extension. Anterior drawer: Negative Posterior drawer: Negative Lachman: Negative McMurray's: Negative Neurovascularly intact.  No evidence of lymphatic disease.  Patient does have known underlying arthritis/degenerative changes, also with known meniscal tear.  History concerning for possible meniscal injury or aggravation of known pathology, however lack of significant swelling and based on exam today, I do feel that this is less likely.  Symptoms could be related to mild sprain of MCL, reviewed general considerations.  Feel it would be appropriate to proceed with conservative measures, did provide patient with handout reviewing home exercise program.  May continue with activities as tolerated.  If symptoms are not continuing to improve as they have been, can give  consideration to initial x-ray imaging.  Could consider getting updated MRI, however I feel that this would be less likely to be of benefit and do not feel that expected findings would change initial management

## 2023-10-20 NOTE — Patient Instructions (Signed)

## 2023-10-20 NOTE — Progress Notes (Signed)
    Procedures performed today:    None.  Independent interpretation of notes and tests performed by another provider:   None.  Brief History, Exam, Impression, and Recommendations:    BP (!) 134/90 (BP Location: Left Arm, Patient Position: Sitting, Cuff Size: Normal)   Pulse 68   Ht 5' 5 (1.651 m)   Wt 182 lb 6.4 oz (82.7 kg)   SpO2 97%   BMI 30.35 kg/m   Left knee pain, unspecified chronicity Assessment & Plan: Patient with history of left knee pain and prior ACL reconstruction, did have new injury.  He reports that about a week ago, he was playing pickle ball, went for a backhand that shot and felt a twinge in his left knee, primarily over anteromedial aspect of joint.  Saw playing for about 3 to 4 minutes and then was able to resume, continue playing for at least an hour after that without any issue.  He notes that upon waking the next day, he had notable increase in pain, reports that pain at that time was about 8 out of 10.  He did not have any swelling.  Pain was primarily along medial aspect of the knee.  He did rest the knee, used topical treatment including icing.  Over the coming days, he did gradually return to activities, did golf over the weekend with mild discomfort but without significant restrictions.  Rates pain currently at about a 2-3 out of 10.  He did have MRI completed previously which did show evidence of meniscal tear, degenerative changes, chondral loss. On exam, Left knee: No obvious deformity. No effusion.  Negative patellar grind.  Negative crepitus. Full ROM for flexion and extension.  Strength 5 out of 5 for flexion and extension. Anterior drawer: Negative Posterior drawer: Negative Lachman: Negative McMurray's: Negative Neurovascularly intact.  No evidence of lymphatic disease.  Patient does have known underlying arthritis/degenerative changes, also with known meniscal tear.  History concerning for possible meniscal injury or aggravation of known  pathology, however lack of significant swelling and based on exam today, I do feel that this is less likely.  Symptoms could be related to mild sprain of MCL, reviewed general considerations.  Feel it would be appropriate to proceed with conservative measures, did provide patient with handout reviewing home exercise program.  May continue with activities as tolerated.  If symptoms are not continuing to improve as they have been, can give consideration to initial x-ray imaging.  Could consider getting updated MRI, however I feel that this would be less likely to be of benefit and do not feel that expected findings would change initial management   Return if symptoms worsen or fail to improve.   ___________________________________________ Ivan Lacher de [place], MD, ABFM, CAQSM Primary Care and Sports Medicine [facility]

## 2023-11-10 ENCOUNTER — Encounter (HOSPITAL_BASED_OUTPATIENT_CLINIC_OR_DEPARTMENT_OTHER): Payer: Self-pay | Admitting: Family Medicine

## 2023-12-27 ENCOUNTER — Other Ambulatory Visit: Payer: Self-pay | Admitting: Cardiovascular Disease

## 2024-01-04 ENCOUNTER — Encounter: Payer: Self-pay | Admitting: Radiology

## 2024-01-20 ENCOUNTER — Other Ambulatory Visit: Payer: Self-pay | Admitting: Cardiovascular Disease

## 2024-01-25 ENCOUNTER — Encounter (HOSPITAL_BASED_OUTPATIENT_CLINIC_OR_DEPARTMENT_OTHER): Payer: Self-pay | Admitting: Family Medicine

## 2024-01-25 ENCOUNTER — Other Ambulatory Visit (HOSPITAL_BASED_OUTPATIENT_CLINIC_OR_DEPARTMENT_OTHER): Payer: Self-pay | Admitting: *Deleted

## 2024-01-25 ENCOUNTER — Encounter (HOSPITAL_BASED_OUTPATIENT_CLINIC_OR_DEPARTMENT_OTHER): Admitting: Family Medicine

## 2024-01-25 DIAGNOSIS — Z0000 Encounter for general adult medical examination without abnormal findings: Secondary | ICD-10-CM

## 2024-01-26 LAB — CBC WITH DIFFERENTIAL/PLATELET
Basophils Absolute: 0 x10E3/uL (ref 0.0–0.2)
Basos: 1 %
EOS (ABSOLUTE): 0.1 x10E3/uL (ref 0.0–0.4)
Eos: 2 %
Hematocrit: 44.4 % (ref 37.5–51.0)
Hemoglobin: 15 g/dL (ref 13.0–17.7)
Immature Grans (Abs): 0 x10E3/uL (ref 0.0–0.1)
Immature Granulocytes: 0 %
Lymphocytes Absolute: 1.4 x10E3/uL (ref 0.7–3.1)
Lymphs: 28 %
MCH: 32 pg (ref 26.6–33.0)
MCHC: 33.8 g/dL (ref 31.5–35.7)
MCV: 95 fL (ref 79–97)
Monocytes Absolute: 0.5 x10E3/uL (ref 0.1–0.9)
Monocytes: 11 %
Neutrophils Absolute: 2.9 x10E3/uL (ref 1.4–7.0)
Neutrophils: 58 %
Platelets: 236 x10E3/uL (ref 150–450)
RBC: 4.69 x10E6/uL (ref 4.14–5.80)
RDW: 12.7 % (ref 11.6–15.4)
WBC: 4.9 x10E3/uL (ref 3.4–10.8)

## 2024-01-26 LAB — COMPREHENSIVE METABOLIC PANEL WITH GFR
ALT: 21 IU/L (ref 0–44)
AST: 18 IU/L (ref 0–40)
Albumin: 5 g/dL — ABNORMAL HIGH (ref 3.8–4.9)
Alkaline Phosphatase: 54 IU/L (ref 47–123)
BUN/Creatinine Ratio: 26 — ABNORMAL HIGH (ref 9–20)
BUN: 20 mg/dL (ref 6–24)
Bilirubin Total: 0.7 mg/dL (ref 0.0–1.2)
CO2: 24 mmol/L (ref 20–29)
Calcium: 9.7 mg/dL (ref 8.7–10.2)
Chloride: 100 mmol/L (ref 96–106)
Creatinine, Ser: 0.77 mg/dL (ref 0.76–1.27)
Globulin, Total: 1.9 g/dL (ref 1.5–4.5)
Glucose: 94 mg/dL (ref 70–99)
Potassium: 4.9 mmol/L (ref 3.5–5.2)
Sodium: 138 mmol/L (ref 134–144)
Total Protein: 6.9 g/dL (ref 6.0–8.5)
eGFR: 107 mL/min/1.73 (ref >59)

## 2024-01-26 LAB — LIPID PANEL
Chol/HDL Ratio: 2.3 ratio (ref 0.0–5.0)
Cholesterol, Total: 192 mg/dL (ref 100–199)
HDL: 82 mg/dL (ref >39)
LDL Chol Calc (NIH): 102 mg/dL — ABNORMAL HIGH (ref 0–99)
Triglycerides: 38 mg/dL (ref 0–149)
VLDL Cholesterol Cal: 8 mg/dL (ref 5–40)

## 2024-01-26 LAB — HEMOGLOBIN A1C
Est. average glucose Bld gHb Est-mCnc: 103 mg/dL
Hgb A1c MFr Bld: 5.2 % (ref 4.8–5.6)

## 2024-01-26 LAB — TSH RFX ON ABNORMAL TO FREE T4: TSH: 1.71 u[IU]/mL (ref 0.450–4.500)

## 2024-01-27 ENCOUNTER — Ambulatory Visit (HOSPITAL_BASED_OUTPATIENT_CLINIC_OR_DEPARTMENT_OTHER): Payer: Self-pay | Admitting: Family Medicine

## 2024-02-03 ENCOUNTER — Ambulatory Visit (INDEPENDENT_AMBULATORY_CARE_PROVIDER_SITE_OTHER): Admitting: Family Medicine

## 2024-02-03 ENCOUNTER — Encounter (HOSPITAL_BASED_OUTPATIENT_CLINIC_OR_DEPARTMENT_OTHER): Payer: Self-pay | Admitting: Family Medicine

## 2024-02-03 VITALS — BP 137/86 | HR 68 | Temp 97.8°F | Resp 18 | Ht 65.0 in | Wt 190.0 lb

## 2024-02-03 DIAGNOSIS — Z0000 Encounter for general adult medical examination without abnormal findings: Secondary | ICD-10-CM | POA: Diagnosis not present

## 2024-02-03 NOTE — Assessment & Plan Note (Signed)

## 2024-02-03 NOTE — Progress Notes (Signed)
 Subjective:    CC: Annual Physical Exam  HPI: Curtis Arellano is a 53 y.o. presenting for annual physical  I reviewed the past medical history, family history, social history, surgical history, and allergies today and no changes were needed.  Please see the problem list section below in epic for further details.  Past Medical History: Past Medical History:  Diagnosis Date   Anxiety    GERD (gastroesophageal reflux disease)    Incarcerated umbilical hernia 04/2012   Left knee pain    Normal cardiac stress test 2013   Refusal of blood transfusions as patient is Jehovah's Witness 02/21/11   Past Surgical History: Past Surgical History:  Procedure Laterality Date   ANTERIOR CRUCIATE LIGAMENT REPAIR  ~ 1998   left knee   EYE SURGERY  ~ 2004   growth on right eye cut off   FRACTURE SURGERY  ~ 1992   broken nose   HERNIA REPAIR     INSERTION OF MESH N/A 04/27/2013   Procedure: INSERTION OF MESH;  Surgeon: Elon CHRISTELLA Pacini, MD;  Location: [facility] OR;  Service: General;  Laterality: N/A;   NASAL SEPTUM SURGERY  ~ 1990   UMBILICAL HERNIA REPAIR N/A 04/27/2013   Procedure: HERNIA REPAIR UMBILICAL ADULT;  Surgeon: Elon CHRISTELLA Pacini, MD;  Location: [facility] OR;  Service: General;  Laterality: N/A;   UPPER GASTROINTESTINAL ENDOSCOPY  2013   Dr.Mann   VASECTOMY     Social History: Social History   Socioeconomic History   Marital status: Married    Spouse name: Not on file   Number of children: Not on file   Years of education: Not on file   Highest education level: Not on file  Occupational History   Occupation: IT specialist    Employer: [employer]   Tobacco Use   Smoking status: Never    Passive exposure: Never   Smokeless tobacco: Never  Vaping Use   Vaping status: Never Used  Substance and Sexual Activity   Alcohol use: Yes    Alcohol/week: 5.0 - 6.0 standard drinks of alcohol    Types: 5 - 6 Standard drinks or equivalent per week   Drug use: No   Sexual activity: Yes     Birth control/protection: Surgical    Comment: married  Other Topics Concern   Not on file  Social History Narrative   Per [employer] survey form - pt does not exercise   Social Drivers of Health   Financial Resource Strain: Low Risk  (02/03/2024)   Overall Financial Resource Strain (CARDIA)    Difficulty of Paying Living Expenses: Not hard at all  Food Insecurity: No Food Insecurity (02/03/2024)   Hunger Vital Sign    Worried About Running Out of Food in the Last Year: Never true    Ran Out of Food in the Last Year: Never true  Transportation Needs: No Transportation Needs (02/03/2024)   PRAPARE - Administrator, Civil Service (Medical): No    Lack of Transportation (Non-Medical): No  Physical Activity: Sufficiently Active (02/03/2024)   Exercise Vital Sign    Days of Exercise per Week: 7 days    Minutes of Exercise per Session: 30 min  Stress: No Stress Concern Present (02/03/2024)   [employer] of Occupational Health - Occupational Stress Questionnaire    Feeling of Stress: Not at all  Social Connections: Moderately Integrated (02/03/2024)   Social Connection and Isolation Panel    Frequency of Communication with Friends and Family:  More than three times a week    Frequency of Social Gatherings with Friends and Family: More than three times a week    Attends Religious Services: More than 4 times per year    Active Member of [employer] or Organizations: No    Attends Engineer, Structural: Never    Marital Status: Married   Family History: Family History  Problem Relation Age of Onset   Gallbladder disease Mother        removed   Anxiety disorder Mother    Hypertension Father    Hyperlipidemia Father    Stroke Maternal Grandmother    Ulcers Maternal Grandfather        per pt stomach ulcer with bleeding   Colon cancer Neg Hx    Colon polyps Neg Hx    Esophageal cancer Neg Hx    Stomach cancer Neg Hx    Rectal cancer Neg Hx     Allergies: Allergies  Allergen Reactions   Penicillins Rash   Medications: See med rec.  Review of Systems: No headache, visual changes, nausea, vomiting, diarrhea, constipation, dizziness, abdominal pain, skin rash, fevers, chills, night sweats, swollen lymph nodes, weight loss, chest pain, body aches, joint swelling, muscle aches, shortness of breath, mood changes, visual or auditory hallucinations.  Objective:    BP (!) 142/84 (BP Location: Left Arm, Patient Position: Sitting, Cuff Size: Normal)   Pulse 70   Temp 97.8 F (36.6 C) (Oral)   Resp 18   Ht 5' 5 (1.651 m)   Wt 190 lb (86.2 kg)   SpO2 99%   BMI 31.62 kg/m   General: Well Developed, well nourished, and in no acute distress.  Neuro: Alert and oriented x3, extra-ocular muscles intact, sensation grossly intact. Cranial nerves II through XII are intact, motor, sensory, and coordinative functions are all intact. HEENT: Normocephalic, atraumatic, pupils equal round reactive to light, neck supple, no masses, no lymphadenopathy, thyroid nonpalpable. Oropharynx, nasopharynx, external ear canals are unremarkable. Skin: Warm and dry, no rashes noted.  Cardiac: Regular rate and rhythm, no murmurs rubs or gallops.  Respiratory: Clear to auscultation bilaterally. Not using accessory muscles, speaking in full sentences.  Abdominal: Soft, nontender, nondistended, positive bowel sounds, no masses, no organomegaly.  Musculoskeletal: Shoulder, elbow, wrist, hip, knee, ankle stable, and with full range of motion.  Impression and Recommendations:    Wellness examination Assessment & Plan: Routine HCM labs reviewed. HCM reviewed/discussed. Anticipatory guidance regarding healthy weight, lifestyle and choices given.  Recommend healthy diet.  Recommend approximately 150 minutes/week of moderate intensity exercise Recommend regular dental and vision exams Always use seatbelt/lap and shoulder restraints Recommend using smoke alarms and  checking batteries at least twice a year Recommend using sunscreen when outside Discussed colon cancer screening recommendations, options.  Patient is UTD Discussed immunization recommendations   Patient notes that he continues to have left knee pain.  It is more notably affecting activities that he is wanting to do including pickleball and other physical activity. We reviewed considerations and would be reasonable to proceed with steroid injection given persistent symptoms.  We can arrange for this in the near future.  Did discuss details of procedure, expected benefits, potential risks.  He does want to proceed with scheduling this.  Return in about 6 months (around 08/03/2024) for hypertension.   ___________________________________________ Hoy Fallert de [place], MD, ABFM, CAQSM Primary Care and Sports Medicine [facility]

## 2024-02-04 ENCOUNTER — Other Ambulatory Visit: Payer: Self-pay | Admitting: Cardiovascular Disease

## 2024-02-08 ENCOUNTER — Encounter (HOSPITAL_BASED_OUTPATIENT_CLINIC_OR_DEPARTMENT_OTHER): Payer: Self-pay | Admitting: Family Medicine

## 2024-02-08 ENCOUNTER — Ambulatory Visit (INDEPENDENT_AMBULATORY_CARE_PROVIDER_SITE_OTHER): Admitting: Family Medicine

## 2024-02-08 VITALS — BP 144/91 | HR 73 | Temp 98.1°F | Resp 18 | Ht 65.0 in | Wt 189.0 lb

## 2024-02-08 DIAGNOSIS — M1712 Unilateral primary osteoarthritis, left knee: Secondary | ICD-10-CM | POA: Insufficient documentation

## 2024-02-08 NOTE — Progress Notes (Signed)
    Procedures performed today:    Procedure: Injection of the left knee joint Verbal informed consent obtained.  Time-out conducted.  Noted no overlying erythema, induration, or other signs of local infection.  Skin prepped in a sterile fashion.  Local anesthesia: Topical Ethyl chloride.  With sterile technique: 2 cc Kenalog 40, 3 cc lidocaine  injected easily Completed without difficulty  Advised to call if fevers/chills, erythema, induration, drainage, or persistent bleeding.  Impression: Technically successful injection.  Independent interpretation of notes and tests performed by another provider:   None.  Brief History, Exam, Impression, and Recommendations:    BP (!) 144/91 (BP Location: Left Arm, Patient Position: Sitting, Cuff Size: Normal)   Pulse 73   Temp 98.1 F (36.7 C) (Oral)   Resp 18   Ht 5' 5 (1.651 m)   Wt 189 lb (85.7 kg)   SpO2 99%   BMI 31.45 kg/m   Osteoarthritis of left knee, unspecified osteoarthritis type Assessment & Plan: Patient with underlying osteoarthritis of left knee as well as meniscal tear.  He has had evaluation with orthopedic specialist.  Given ongoing symptoms, was recommended to have steroid injection to help control symptoms pending progress.  He has had increased symptoms and would like to proceed with injection today.  We did discuss considerations in regards to management options.  He does want to proceed with steroid injection today.  We discussed risks and benefits associated with this.  Procedure completed today, see procedure note above. Will monitor progress moving forward.  If good response, can consider repeating in the future.  If response is suboptimal, then may need further evaluation with orthopedic surgeon to determine other treatment interventions.   Return if symptoms worsen or fail to improve.   ___________________________________________ Vaness Jelinski de [place], MD, ABFM, [facility] Primary Care and Sports Medicine [facility]

## 2024-02-08 NOTE — Assessment & Plan Note (Signed)
 Patient with underlying osteoarthritis of left knee as well as meniscal tear.  He has had evaluation with orthopedic specialist.  Given ongoing symptoms, was recommended to have steroid injection to help control symptoms pending progress.  He has had increased symptoms and would like to proceed with injection today.  We did discuss considerations in regards to management options.  He does want to proceed with steroid injection today.  We discussed risks and benefits associated with this.  Procedure completed today, see procedure note above. Will monitor progress moving forward.  If good response, can consider repeating in the future.  If response is suboptimal, then may need further evaluation with orthopedic surgeon to determine other treatment interventions.

## 2024-02-09 NOTE — Telephone Encounter (Signed)
 Refill sent for 15 days.  Message sent to scheduling.  Pt overdue for follow up for medication refills.  2nd attempt.

## 2024-02-16 ENCOUNTER — Encounter: Payer: Self-pay | Admitting: Cardiovascular Disease

## 2024-02-16 NOTE — Telephone Encounter (Signed)
 error

## 2024-08-03 ENCOUNTER — Ambulatory Visit (HOSPITAL_BASED_OUTPATIENT_CLINIC_OR_DEPARTMENT_OTHER): Admitting: Family Medicine
